# Patient Record
Sex: Female | Born: 1950 | Race: White | Hispanic: No | Marital: Married | Smoking: Never smoker
Health system: Southern US, Community
[De-identification: ages and names within clinical notes are randomized; demographics above are authoritative.]

## PROBLEM LIST (undated history)

## (undated) ENCOUNTER — Emergency Department (HOSPITAL_BASED_OUTPATIENT_CLINIC_OR_DEPARTMENT_OTHER): Admission: EM | Payer: Medicare Other

## (undated) DIAGNOSIS — M79604 Pain in right leg: Secondary | ICD-10-CM

## (undated) DIAGNOSIS — F329 Major depressive disorder, single episode, unspecified: Secondary | ICD-10-CM

## (undated) DIAGNOSIS — R079 Chest pain, unspecified: Secondary | ICD-10-CM

## (undated) DIAGNOSIS — Z8489 Family history of other specified conditions: Secondary | ICD-10-CM

## (undated) DIAGNOSIS — M069 Rheumatoid arthritis, unspecified: Secondary | ICD-10-CM

## (undated) DIAGNOSIS — F32A Depression, unspecified: Secondary | ICD-10-CM

## (undated) DIAGNOSIS — S42209A Unspecified fracture of upper end of unspecified humerus, initial encounter for closed fracture: Secondary | ICD-10-CM

## (undated) DIAGNOSIS — Z973 Presence of spectacles and contact lenses: Secondary | ICD-10-CM

## (undated) DIAGNOSIS — N390 Urinary tract infection, site not specified: Secondary | ICD-10-CM

## (undated) DIAGNOSIS — M8580 Other specified disorders of bone density and structure, unspecified site: Secondary | ICD-10-CM

## (undated) HISTORY — DX: Other specified disorders of bone density and structure, unspecified site: M85.80

## (undated) HISTORY — DX: Rheumatoid arthritis, unspecified: M06.9

## (undated) HISTORY — DX: Pain in right leg: M79.604

## (undated) HISTORY — PX: FRACTURE SURGERY: SHX138

## (undated) HISTORY — PX: ABDOMINAL HYSTERECTOMY: SHX81

## (undated) HISTORY — PX: COLONOSCOPY: SHX174

## (undated) HISTORY — PX: PARTIAL HYSTERECTOMY: SHX80

## (undated) HISTORY — PX: TONSILLECTOMY: SUR1361

## (undated) HISTORY — DX: Urinary tract infection, site not specified: N39.0

## (undated) HISTORY — DX: Chest pain, unspecified: R07.9

## (undated) HISTORY — PX: WISDOM TOOTH EXTRACTION: SHX21

## (undated) HISTORY — PX: KNEE ARTHROSCOPY: SHX127

---

## 1998-11-25 ENCOUNTER — Other Ambulatory Visit: Admission: RE | Admit: 1998-11-25 | Discharge: 1998-11-25 | Payer: Self-pay | Admitting: *Deleted

## 2000-02-10 ENCOUNTER — Other Ambulatory Visit: Admission: RE | Admit: 2000-02-10 | Discharge: 2000-02-10 | Payer: Self-pay | Admitting: *Deleted

## 2001-05-03 ENCOUNTER — Other Ambulatory Visit: Admission: RE | Admit: 2001-05-03 | Discharge: 2001-05-03 | Payer: Self-pay | Admitting: *Deleted

## 2003-01-21 ENCOUNTER — Other Ambulatory Visit: Admission: RE | Admit: 2003-01-21 | Discharge: 2003-01-21 | Payer: Self-pay | Admitting: Obstetrics and Gynecology

## 2007-05-25 ENCOUNTER — Encounter: Admission: RE | Admit: 2007-05-25 | Discharge: 2007-05-25 | Payer: Self-pay | Admitting: Family Medicine

## 2010-09-03 ENCOUNTER — Encounter: Admission: RE | Admit: 2010-09-03 | Discharge: 2010-09-03 | Payer: Self-pay | Admitting: Rheumatology

## 2010-12-05 ENCOUNTER — Encounter: Payer: Self-pay | Admitting: Family Medicine

## 2016-04-19 ENCOUNTER — Other Ambulatory Visit: Payer: Self-pay

## 2016-04-19 ENCOUNTER — Other Ambulatory Visit: Payer: Self-pay | Admitting: Family Medicine

## 2016-04-19 DIAGNOSIS — Z1231 Encounter for screening mammogram for malignant neoplasm of breast: Secondary | ICD-10-CM

## 2016-04-20 ENCOUNTER — Ambulatory Visit
Admission: RE | Admit: 2016-04-20 | Discharge: 2016-04-20 | Disposition: A | Discharge status: Home / Self Care | Payer: 59 | Source: Ambulatory Visit | Attending: Family Medicine | Admitting: Family Medicine

## 2016-04-20 DIAGNOSIS — Z1231 Encounter for screening mammogram for malignant neoplasm of breast: Secondary | ICD-10-CM

## 2016-07-27 DIAGNOSIS — M8580 Other specified disorders of bone density and structure, unspecified site: Secondary | ICD-10-CM | POA: Insufficient documentation

## 2016-07-27 DIAGNOSIS — G4700 Insomnia, unspecified: Secondary | ICD-10-CM | POA: Insufficient documentation

## 2016-07-27 DIAGNOSIS — M25569 Pain in unspecified knee: Secondary | ICD-10-CM | POA: Insufficient documentation

## 2016-07-27 DIAGNOSIS — G4762 Sleep related leg cramps: Secondary | ICD-10-CM | POA: Insufficient documentation

## 2016-07-27 DIAGNOSIS — M069 Rheumatoid arthritis, unspecified: Secondary | ICD-10-CM | POA: Insufficient documentation

## 2016-11-01 ENCOUNTER — Other Ambulatory Visit: Payer: Self-pay | Admitting: Family Medicine

## 2016-11-01 ENCOUNTER — Ambulatory Visit (HOSPITAL_COMMUNITY)
Admission: RE | Admit: 2016-11-01 | Discharge: 2016-11-01 | Disposition: A | Discharge status: Home / Self Care | Payer: 59 | Source: Ambulatory Visit | Attending: Cardiology | Admitting: Cardiology

## 2016-11-01 ENCOUNTER — Other Ambulatory Visit (HOSPITAL_COMMUNITY): Payer: Self-pay | Admitting: Family Medicine

## 2016-11-01 DIAGNOSIS — R52 Pain, unspecified: Secondary | ICD-10-CM

## 2016-11-01 DIAGNOSIS — M79604 Pain in right leg: Secondary | ICD-10-CM | POA: Insufficient documentation

## 2016-11-01 NOTE — Progress Notes (Signed)
VASCULAR LAB PRELIMINARY  PRELIMINARY  PRELIMINARY  PRELIMINARY  Right lower extremity venous duplex completed.    Preliminary report:  Right:  No evidence of DVT, superficial thrombosis, or Baker's cyst.  Rashida Ladouceur, RVS 11/01/2016, 10:12 AM

## 2016-11-08 ENCOUNTER — Telehealth: Payer: Self-pay | Admitting: Cardiology

## 2016-11-08 NOTE — Telephone Encounter (Signed)
Received records from [facility] for appointment on 11/21/16 wiith Dr Swaziland.  Records given to [facility] (medical records) for Dr Elvis Coil schedule on 11/21/16. lp

## 2016-11-21 ENCOUNTER — Ambulatory Visit: Payer: Self-pay | Admitting: Cardiology

## 2016-12-11 NOTE — Progress Notes (Signed)
Cardiology Office Note    Date:  12/12/2016   ID:  [name], DOB 1951/04/20, MRN [id number]  PCP:  Lilia Argue  Cardiologist:  Peter Swaziland, MD    History of Present Illness:  Sonya Fowler is a 66 y.o. female seen for evaluation of chest pain. She has a history of RA, mild hypercholesterolemia, and strong family history of CAD. She reports that around Thanksgiving she had some chest pain underneath her left breast. This was at rest- typically lying down. No radiation. No dyspnea, palpitations, N/V, or diaphoresis. It lasted several days then resolved. She reports she had a nuclear stress test about 20 years ago. Otherwise no prior cardiac work up. No history of DM, HTN, or tobacco use.    Past Medical History:  Diagnosis Date  . Chest pain   . Leg pain, right   . Osteopenia   . Rheumatoid arthritis (HCC)   . UTI (urinary tract infection)     Past Surgical History:  Procedure Laterality Date  . KNEE ARTHROSCOPY Right   . PARTIAL HYSTERECTOMY    . WISDOM TOOTH EXTRACTION      Current Medications: Outpatient Medications Prior to Visit  Medication Sig Dispense Refill  . cetirizine (ZYRTEC) 10 MG tablet Take 10 mg by mouth daily.    . DOCOSAHEXAENOIC ACID PO Take 1 g by mouth daily.    Marland Kitchen estrogens, conjugated, (PREMARIN) 0.3 MG tablet Take 1 tablet by mouth daily.    Marland Kitchen etanercept (ENBREL) 50 MG/ML injection Inject 50 mg into the skin once a week.    . folic acid (FOLVITE) 1 MG tablet Take 1 mg by mouth daily.    . Magnesium Gluconate 27.5 MG TABS Take 500 mg by mouth 2 (two) times daily.    . meloxicam (MOBIC) 15 MG tablet Take 15 mg by mouth daily.    . methotrexate (RHEUMATREX) 2.5 MG tablet Take 7 tablets by mouth once a week.      No facility-administered medications prior to visit.      Allergies:   Morphine; Penicillin g; and Sulfamethoxazole   Social History   Social History  . Marital status: Married    Spouse name: N/A  . Number of  children: 2  . Years of education: N/A   Social History Main Topics  . Smoking status: Never Smoker  . Smokeless tobacco: Never Used  . Alcohol use None  . Drug use: Unknown  . Sexual activity: Not Asked   Other Topics Concern  . None   Social History Narrative  . None     Family History:  The patient's family history includes Atrial fibrillation in her mother; Heart disease in her brother, father, and sister; Hypertension in her brother, father, and sister; Valvular heart disease in her mother.   ROS:   Please see the history of present illness. She does have some right knee pain from an old injury.    ROS All other systems reviewed and are negative.   PHYSICAL EXAM:   VS:  BP 128/82   Pulse 79   Ht 5\' 4"  (1.626 m)   Wt 132 lb 12.8 oz (60.2 kg)   BMI 22.80 kg/m    GEN: Well nourished, well developed, in no acute distress  HEENT: normal  Neck: no JVD, carotid bruits, or masses Cardiac: RRR; no murmurs, rubs, or gallops,no edema  Respiratory:  clear to auscultation bilaterally, normal work of breathing GI: soft, nontender, nondistended, + BS MS: no deformity or atrophy  Skin: warm and dry, no rash Neuro:  Alert and Oriented x 3, Strength and sensation are intact Psych: euthymic mood, full affect  Wt Readings from Last 3 Encounters:  12/12/16 132 lb 12.8 oz (60.2 kg)      Studies/Labs Reviewed:   EKG:  EKG is ordered today.  The ekg ordered today demonstrates NSR with septal Q waves in V1-2. Otherwise normal. I have personally reviewed and interpreted this study.   Recent Labs: No results found for requested labs within last 8760 hours.   Lipid Panel No results found for: CHOL, TRIG, HDL, CHOLHDL, VLDL, LDLCALC, LDLDIRECT  Additional studies/ records that were reviewed today include:  Labs from 10/26/16: cholesterol 191, triglycerides 104, HDL 72, LDL 107. CMET and CBC normal.  ASSESSMENT:    1. Rheumatoid arthritis, involving unspecified site, unspecified  rheumatoid factor presence (HCC)   2. Chest pain at rest   3. Family history of early CAD      PLAN:  In order of problems listed above:  1. She had atypical chest pain in November. She has risk factors for CAD including RA, mild hypercholesterolemia, and very strong family history of CAD. This places her at moderate pretest risk for CAD. I have recommended evaluation with stress testing. We discussed options including nuclear stress testing, Echo, or plain old treadmill testing. Given abnormal Ecg I would recommend a stress Echo. Will arrange at her convenience.     Medication Adjustments/Labs and Tests Ordered: Current medicines are reviewed at length with the patient today.  Concerns regarding medicines are outlined above.  Medication changes, Labs and Tests ordered today are listed in the Patient Instructions below. Patient Instructions  We will schedule you for a stress Echo      Signed, Peter Swaziland, MD  12/12/2016 3:48 PM    [facility] [address], [place], [place], [address] [phone]

## 2016-12-12 ENCOUNTER — Ambulatory Visit (INDEPENDENT_AMBULATORY_CARE_PROVIDER_SITE_OTHER): Payer: BLUE CROSS/BLUE SHIELD | Admitting: Cardiology

## 2016-12-12 ENCOUNTER — Encounter: Payer: Self-pay | Admitting: Cardiology

## 2016-12-12 VITALS — BP 128/82 | HR 79 | Ht 64.0 in | Wt 132.8 lb

## 2016-12-12 DIAGNOSIS — Z8249 Family history of ischemic heart disease and other diseases of the circulatory system: Secondary | ICD-10-CM | POA: Diagnosis not present

## 2016-12-12 DIAGNOSIS — R079 Chest pain, unspecified: Secondary | ICD-10-CM

## 2016-12-12 DIAGNOSIS — M069 Rheumatoid arthritis, unspecified: Secondary | ICD-10-CM | POA: Diagnosis not present

## 2016-12-12 NOTE — Patient Instructions (Signed)
We will schedule you for a stress Echo.   

## 2016-12-29 ENCOUNTER — Telehealth (HOSPITAL_COMMUNITY): Payer: Self-pay | Admitting: *Deleted

## 2016-12-29 NOTE — Telephone Encounter (Signed)
Patient given detailed instructions per Stress Test Requisition Sheet for test on 01/03/17 at 2:30.Patient Notified to arrive 30 minutes early, and that it is imperative to arrive on time for appointment to keep from having the test rescheduled.  Patient verbalized understanding. Sonya Fowler

## 2017-01-03 ENCOUNTER — Ambulatory Visit (HOSPITAL_COMMUNITY): Payer: BLUE CROSS/BLUE SHIELD

## 2017-01-03 ENCOUNTER — Ambulatory Visit (HOSPITAL_COMMUNITY): Payer: BLUE CROSS/BLUE SHIELD | Attending: Cardiovascular Disease

## 2017-01-03 DIAGNOSIS — Z8249 Family history of ischemic heart disease and other diseases of the circulatory system: Secondary | ICD-10-CM | POA: Insufficient documentation

## 2017-01-03 DIAGNOSIS — R079 Chest pain, unspecified: Secondary | ICD-10-CM | POA: Diagnosis not present

## 2017-01-03 DIAGNOSIS — M069 Rheumatoid arthritis, unspecified: Secondary | ICD-10-CM | POA: Diagnosis present

## 2017-06-27 ENCOUNTER — Other Ambulatory Visit: Payer: Self-pay | Admitting: Family Medicine

## 2017-06-27 DIAGNOSIS — Z1231 Encounter for screening mammogram for malignant neoplasm of breast: Secondary | ICD-10-CM

## 2017-07-12 ENCOUNTER — Ambulatory Visit
Admission: RE | Admit: 2017-07-12 | Discharge: 2017-07-12 | Disposition: A | Discharge status: Home / Self Care | Payer: BLUE CROSS/BLUE SHIELD | Source: Ambulatory Visit | Attending: Family Medicine | Admitting: Family Medicine

## 2017-07-12 DIAGNOSIS — Z1231 Encounter for screening mammogram for malignant neoplasm of breast: Secondary | ICD-10-CM

## 2017-09-08 ENCOUNTER — Encounter: Payer: Self-pay | Admitting: Physician Assistant

## 2017-09-27 ENCOUNTER — Ambulatory Visit: Payer: BLUE CROSS/BLUE SHIELD | Admitting: Physician Assistant

## 2017-10-10 ENCOUNTER — Encounter: Payer: Self-pay | Admitting: Internal Medicine

## 2017-12-05 ENCOUNTER — Ambulatory Visit: Payer: BLUE CROSS/BLUE SHIELD | Admitting: Internal Medicine

## 2018-06-18 ENCOUNTER — Other Ambulatory Visit: Payer: Self-pay | Admitting: Family Medicine

## 2018-06-18 DIAGNOSIS — Z1231 Encounter for screening mammogram for malignant neoplasm of breast: Secondary | ICD-10-CM

## 2018-09-18 ENCOUNTER — Ambulatory Visit: Payer: BLUE CROSS/BLUE SHIELD

## 2018-10-04 ENCOUNTER — Ambulatory Visit
Admission: RE | Admit: 2018-10-04 | Discharge: 2018-10-04 | Disposition: A | Discharge status: Home / Self Care | Payer: BLUE CROSS/BLUE SHIELD | Source: Ambulatory Visit | Attending: Family Medicine | Admitting: Family Medicine

## 2018-10-04 DIAGNOSIS — Z1231 Encounter for screening mammogram for malignant neoplasm of breast: Secondary | ICD-10-CM

## 2019-02-26 ENCOUNTER — Other Ambulatory Visit: Payer: Self-pay

## 2019-02-26 ENCOUNTER — Emergency Department (HOSPITAL_COMMUNITY)
Admission: EM | Admit: 2019-02-26 | Discharge: 2019-02-26 | Disposition: A | Discharge status: Home / Self Care | Payer: Medicare Other | Attending: Emergency Medicine | Admitting: Emergency Medicine

## 2019-02-26 ENCOUNTER — Emergency Department (HOSPITAL_COMMUNITY): Payer: Medicare Other

## 2019-02-26 ENCOUNTER — Encounter (HOSPITAL_COMMUNITY): Payer: Self-pay | Admitting: *Deleted

## 2019-02-26 DIAGNOSIS — S4992XA Unspecified injury of left shoulder and upper arm, initial encounter: Secondary | ICD-10-CM | POA: Diagnosis present

## 2019-02-26 DIAGNOSIS — Z79899 Other long term (current) drug therapy: Secondary | ICD-10-CM | POA: Insufficient documentation

## 2019-02-26 DIAGNOSIS — Z880 Allergy status to penicillin: Secondary | ICD-10-CM | POA: Insufficient documentation

## 2019-02-26 DIAGNOSIS — Y999 Unspecified external cause status: Secondary | ICD-10-CM | POA: Insufficient documentation

## 2019-02-26 DIAGNOSIS — Y9389 Activity, other specified: Secondary | ICD-10-CM | POA: Insufficient documentation

## 2019-02-26 DIAGNOSIS — S42202A Unspecified fracture of upper end of left humerus, initial encounter for closed fracture: Secondary | ICD-10-CM

## 2019-02-26 DIAGNOSIS — Y92003 Bedroom of unspecified non-institutional (private) residence as the place of occurrence of the external cause: Secondary | ICD-10-CM | POA: Insufficient documentation

## 2019-02-26 DIAGNOSIS — W010XXA Fall on same level from slipping, tripping and stumbling without subsequent striking against object, initial encounter: Secondary | ICD-10-CM | POA: Insufficient documentation

## 2019-02-26 MED ORDER — OXYCODONE HCL 5 MG PO TABS
5.0000 mg | ORAL_TABLET | Freq: Once | ORAL | Status: AC
  Administered 2019-02-26: 02:00:00 5 mg via ORAL
  Filled 2019-02-26: qty 1

## 2019-02-26 MED ORDER — OXYCODONE HCL 5 MG PO TABS: 5.0000 mg | ORAL_TABLET | ORAL | 0 refills | Status: DC | PRN

## 2019-02-26 MED ORDER — OXYCODONE HCL 5 MG PO TABS
5.0000 mg | ORAL_TABLET | Freq: Once | ORAL | Status: AC
  Administered 2019-02-26: 5 mg via ORAL
  Filled 2019-02-26: qty 1

## 2019-02-26 NOTE — ED Triage Notes (Signed)
Pt stated "I got up to go to the BR and fell."  Pt c/o left shoulder injury.  Pt stated "I took 1 of my husband's pain pills and it didn't help it but I had also taken 2 tylenol before."

## 2019-02-26 NOTE — ED Provider Notes (Signed)
[facility]-EMERGENCY DEPT Provider Note   CSN: [id number] Arrival date & time: 02/26/19  0013    History   Chief Complaint Chief Complaint  Patient presents with  . Shoulder Injury    left    HPI Sonya Fowler is a 68 y.o. female  The history is provided by the patient.  She has history of rheumatoid arthritis with osteopenia and comes in having fallen at home and injured her left upper arm.  She was getting out of bed to go to the bathroom and fell.  She is not sure how she fell.  At home, she took acetaminophen and her husband gave her a dose of oxycodone, but she has not gotten any significant relief with that.  She rates pain a 10/10.  She denies other injury.  Past Medical History:  Diagnosis Date  . Chest pain   . Leg pain, right   . Osteopenia   . Rheumatoid arthritis (HCC)   . UTI (urinary tract infection)     Patient Active Problem List   Diagnosis Date Noted  . Chest pain at rest 12/12/2016  . Family history of early CAD 12/12/2016  . Insomnia 07/27/2016  . Knee pain 07/27/2016  . Osteopenia 07/27/2016  . Rheumatoid arthritis (HCC) 07/27/2016  . Sleep related leg cramps 07/27/2016    Past Surgical History:  Procedure Laterality Date  . KNEE ARTHROSCOPY Right   . PARTIAL HYSTERECTOMY    . WISDOM TOOTH EXTRACTION       OB History   No obstetric history on file.      Home Medications    Prior to Admission medications   Medication Sig Start Date End Date Taking? Authorizing Provider  cetirizine (ZYRTEC) 10 MG tablet Take 10 mg by mouth daily.    [provider]  DOCOSAHEXAENOIC ACID PO Take 1 g by mouth daily.    [provider]  estrogens, conjugated, (PREMARIN) 0.3 MG tablet Take 1 tablet by mouth daily. 10/31/16   [provider]  etanercept (ENBREL) 50 MG/ML injection Inject 50 mg into the skin once a week.    [provider]  folic acid (FOLVITE) 1 MG tablet Take 1 mg by mouth  daily. 10/18/16   [provider]  Magnesium Gluconate 27.5 MG TABS Take 500 mg by mouth 2 (two) times daily.    [provider]  meloxicam (MOBIC) 15 MG tablet Take 15 mg by mouth daily.    [provider]  methotrexate (RHEUMATREX) 2.5 MG tablet Take 7 tablets by mouth once a week.     [provider]    Family History Family History  Problem Relation Age of Onset  . Valvular heart disease Mother   . Atrial fibrillation Mother   . Hypertension Father   . Heart disease Father   . Heart disease Sister   . Hypertension Sister   . Heart disease Brother   . Hypertension Brother     Social History Social History   Tobacco Use  . Smoking status: Never Smoker  . Smokeless tobacco: Never Used  Substance Use Topics  . Alcohol use: Yes    Comment: socially  . Drug use: Never     Allergies   Morphine; Penicillin g; and Sulfamethoxazole   Review of Systems Review of Systems  All other systems reviewed and are negative.    Physical Exam Updated Vital Signs BP 127/74 (BP Location: Right Arm)   Pulse 84   Temp 98.3  F (36.8 C) (Oral)   Resp 17   Ht 5\' 4"  (1.626 m)   Wt 56.2 kg   SpO2 99%   BMI 21.28 kg/m   Physical Exam Vitals signs and nursing note reviewed.    68 year old female, resting comfortably and in no acute distress. Vital signs are normal. Oxygen saturation is 99%, which is normal. Head is normocephalic and atraumatic. PERRLA, EOMI. Oropharynx is clear. Neck is nontender and supple without adenopathy or JVD. Back is nontender and there is no CVA tenderness. Lungs are clear without rales, wheezes, or rhonchi. Chest is nontender. Heart has regular rate and rhythm without murmur. Abdomen is soft, flat, nontender without masses or hepatosplenomegaly and peristalsis is normoactive. Extremities: Swelling and deformity of left upper arm.  Arm is grossly unstable.  Distal neurovascular exam is intact with strong pulses, prompt  capillary refill, normal sensation, normal strength of distal musculature.  Remainder of extremity exam is unremarkable. Skin is warm and dry without rash. Neurologic: Mental status is normal, cranial nerves are intact, there are no motor or sensory deficits.  ED Treatments / Results   Radiology Dg Shoulder Left  Result Date: 02/26/2019 CLINICAL DATA:  Recent fall with arm pain, initial encounter EXAM: LEFT SHOULDER - 2+ VIEW COMPARISON:  None. FINDINGS: Comminuted fracture of the proximal to mid left humerus is noted extending into the region of the surgical neck. The humeral head is rotated but does not appear dislocated. No other bony abnormality is seen. IMPRESSION: Comminuted proximal to mid left humeral fracture. Electronically Signed   By: Alcide Clever M.D.   On: 02/26/2019 01:30   Dg Humerus Left  Result Date: 02/26/2019 CLINICAL DATA:  Recent fall with left arm pain, initial encounter EXAM: LEFT HUMERUS - 2+ VIEW COMPARISON:  None. FINDINGS: Comminuted proximal to mid left humeral fracture with extension into the surgical neck. No other fracture is seen. IMPRESSION: Comminuted left humeral fracture. Electronically Signed   By: Alcide Clever M.D.   On: 02/26/2019 01:31    Procedures .Splint Application Date/Time: 02/26/2019 2:30 AM Performed by: Dione Booze, MD Authorized by: Dione Booze, MD   Consent:    Consent obtained:  Verbal   Consent given by:  Patient   Risks discussed:  Numbness, pain and swelling   Alternatives discussed:  Alternative treatment Pre-procedure details:    Sensation:  Normal   Skin color:  Normal Procedure details:    Laterality:  Left   Location:  Arm   Arm:  L upper arm   Strapping: no     Splint type: Coaptation splint.   Supplies:  Cotton padding, Ortho-Glass and sling Post-procedure details:    Pain:  Improved   Sensation:  Normal   Skin color:  Normal   Patient tolerance of procedure:  Tolerated well, no immediate complications      Medications Ordered in ED Medications  oxyCODONE (Oxy IR/ROXICODONE) immediate release tablet 5 mg (has no administration in time range)     Initial Impression / Assessment and Plan / ED Course  I have reviewed the triage vital signs and the nursing notes.  Pertinent imaging results that were available during my care of the patient were reviewed by me and considered in my medical decision making (see chart for details).  Fall with injury to left upper arm.  X-rays show comminuted fracture of the proximal humerus extending to the surgical neck.  Case was discussed with Dr. Lequita Halt, on-call for orthopedics.  He requests she be  placed in a coaptation splint and referred to his office for follow-up.  Old records are reviewed, and she has no relevant past visits.  Following placement in splint, pain was somewhat improved.  She is instructed to call the orthopedic surgeon for follow-up visit at which time decision will be made about operative versus nonoperative management.  She is discharged with prescription for oxycodone.  Final Clinical Impressions(s) / ED Diagnoses   Final diagnoses:  Closed fracture of proximal end of left humerus, initial encounter    ED Discharge Orders         Ordered    oxyCODONE (OXY IR/ROXICODONE) 5 MG immediate release tablet  Every 4 hours PRN     02/26/19 0337           Dione BoozeGlick, Layia Walla, MD 02/26/19 [phone]

## 2019-02-26 NOTE — ED Notes (Signed)
Ortho at bedside.

## 2019-02-26 NOTE — Progress Notes (Signed)
Orthopedic Tech Progress Note Patient Details:  Sonya Fowler 01/19/1951 [id number]  Ortho Devices Type of Ortho Device: Arm sling, Coapt Ortho Device/Splint Location: lue Ortho Device/Splint Interventions: Ordered, Application, Adjustment   Post Interventions Patient Tolerated: Fair Instructions Provided: Care of device, Adjustment of device   Trinna Post 02/26/2019, 3:13 AM

## 2019-02-26 NOTE — Discharge Instructions (Addendum)
Apply ice for thirty minutes at a time as needed.  Call the orthopedic surgeon and ask for an appointment as soon as possible.

## 2019-02-26 NOTE — ED Notes (Signed)
Pt verbalized discharge instructions and follow up care. Alert and ambulatory. Husband picking pt up

## 2019-02-27 ENCOUNTER — Other Ambulatory Visit: Payer: Self-pay

## 2019-02-27 ENCOUNTER — Encounter (HOSPITAL_COMMUNITY): Payer: Self-pay | Admitting: *Deleted

## 2019-02-27 MED ORDER — TRANEXAMIC ACID-NACL 1000-0.7 MG/100ML-% IV SOLN
1000.0000 mg | INTRAVENOUS | Status: AC
  Administered 2019-02-28: 1000 mg via INTRAVENOUS
  Filled 2019-02-27: qty 100

## 2019-02-27 NOTE — Progress Notes (Signed)
Pt denies SOB, chest pain, and being under the care of a cardiologist. Pt denies having a cardiac cath. Pt denies having a chest x ray within the last year. Pt denies recent labs. Pt made aware to stop taking Aspirin (unless otherwise advised by surgeon), vitamins, fish and Black Cumin oil, Tumeric and herbal medications. Do not take any NSAIDs ie: Ibuprofen, Advil, Naproxen (Aleve), Motrin, Mobic, BC and Goody Powder. Coronavirus Screening Pt denies that she and her spouse tested positive for COVID-19 Pt denies that she and her spouse experienced the following symptoms:  Cough yes/no: No Fever (>100.15F)  yes/no: No Runny nose yes/no: No Sore throat yes/no: No Difficulty breathing/shortness of breath  yes/no: No  Have you or a family member traveled in the last 14 days and where? yes/no: No  Pt reminded  that hospital visitation restrictions are in effect and the importance of the restrictions.  Pt verbalized understanding of all pre-op instructions.

## 2019-02-28 ENCOUNTER — Encounter (HOSPITAL_COMMUNITY): Payer: Self-pay | Admitting: *Deleted

## 2019-02-28 ENCOUNTER — Ambulatory Visit (HOSPITAL_COMMUNITY): Payer: Medicare Other

## 2019-02-28 ENCOUNTER — Observation Stay (HOSPITAL_COMMUNITY)
Admission: RE | Admit: 2019-02-28 | Discharge: 2019-03-01 | Disposition: A | Discharge status: Home / Self Care | Payer: Medicare Other | Attending: Orthopedic Surgery | Admitting: Orthopedic Surgery

## 2019-02-28 ENCOUNTER — Encounter (HOSPITAL_COMMUNITY)
Admission: RE | Disposition: A | Discharge status: Home / Self Care | Payer: Self-pay | Source: Home / Self Care | Attending: Orthopedic Surgery

## 2019-02-28 ENCOUNTER — Other Ambulatory Visit: Payer: Self-pay

## 2019-02-28 ENCOUNTER — Ambulatory Visit (HOSPITAL_COMMUNITY): Payer: Medicare Other | Admitting: Certified Registered"

## 2019-02-28 DIAGNOSIS — F329 Major depressive disorder, single episode, unspecified: Secondary | ICD-10-CM | POA: Diagnosis not present

## 2019-02-28 DIAGNOSIS — Z8249 Family history of ischemic heart disease and other diseases of the circulatory system: Secondary | ICD-10-CM | POA: Insufficient documentation

## 2019-02-28 DIAGNOSIS — Z791 Long term (current) use of non-steroidal anti-inflammatories (NSAID): Secondary | ICD-10-CM | POA: Diagnosis not present

## 2019-02-28 DIAGNOSIS — M8580 Other specified disorders of bone density and structure, unspecified site: Secondary | ICD-10-CM | POA: Insufficient documentation

## 2019-02-28 DIAGNOSIS — S42309A Unspecified fracture of shaft of humerus, unspecified arm, initial encounter for closed fracture: Secondary | ICD-10-CM | POA: Diagnosis present

## 2019-02-28 DIAGNOSIS — S42202A Unspecified fracture of upper end of left humerus, initial encounter for closed fracture: Secondary | ICD-10-CM | POA: Diagnosis present

## 2019-02-28 DIAGNOSIS — M069 Rheumatoid arthritis, unspecified: Secondary | ICD-10-CM | POA: Diagnosis not present

## 2019-02-28 DIAGNOSIS — Z79899 Other long term (current) drug therapy: Secondary | ICD-10-CM | POA: Diagnosis not present

## 2019-02-28 DIAGNOSIS — Z880 Allergy status to penicillin: Secondary | ICD-10-CM | POA: Diagnosis not present

## 2019-02-28 DIAGNOSIS — W1830XA Fall on same level, unspecified, initial encounter: Secondary | ICD-10-CM | POA: Diagnosis not present

## 2019-02-28 DIAGNOSIS — Z881 Allergy status to other antibiotic agents status: Secondary | ICD-10-CM | POA: Diagnosis not present

## 2019-02-28 DIAGNOSIS — Z885 Allergy status to narcotic agent status: Secondary | ICD-10-CM | POA: Insufficient documentation

## 2019-02-28 DIAGNOSIS — Z882 Allergy status to sulfonamides status: Secondary | ICD-10-CM | POA: Diagnosis not present

## 2019-02-28 DIAGNOSIS — Z419 Encounter for procedure for purposes other than remedying health state, unspecified: Secondary | ICD-10-CM

## 2019-02-28 DIAGNOSIS — Z90710 Acquired absence of both cervix and uterus: Secondary | ICD-10-CM | POA: Diagnosis not present

## 2019-02-28 DIAGNOSIS — S42292A Other displaced fracture of upper end of left humerus, initial encounter for closed fracture: Secondary | ICD-10-CM | POA: Diagnosis not present

## 2019-02-28 HISTORY — DX: Presence of spectacles and contact lenses: Z97.3

## 2019-02-28 HISTORY — DX: Depression, unspecified: F32.A

## 2019-02-28 HISTORY — DX: Family history of other specified conditions: Z84.89

## 2019-02-28 HISTORY — DX: Unspecified fracture of upper end of unspecified humerus, initial encounter for closed fracture: S42.209A

## 2019-02-28 HISTORY — DX: Major depressive disorder, single episode, unspecified: F32.9

## 2019-02-28 HISTORY — PX: ORIF HUMERUS FRACTURE: SHX2126

## 2019-02-28 LAB — CBC
HCT: 37.8 % (ref 36.0–46.0)
Hemoglobin: 12 g/dL (ref 12.0–15.0)
MCH: 32.4 pg (ref 26.0–34.0)
MCHC: 31.7 g/dL (ref 30.0–36.0)
MCV: 102.2 fL — ABNORMAL HIGH (ref 80.0–100.0)
Platelets: 156 10*3/uL (ref 150–400)
RBC: 3.7 MIL/uL — ABNORMAL LOW (ref 3.87–5.11)
RDW: 12.8 % (ref 11.5–15.5)
WBC: 5.5 10*3/uL (ref 4.0–10.5)
nRBC: 0 % (ref 0.0–0.2)

## 2019-02-28 LAB — BASIC METABOLIC PANEL
Anion gap: 11 (ref 5–15)
BUN: 11 mg/dL (ref 8–23)
CO2: 23 mmol/L (ref 22–32)
Calcium: 8.4 mg/dL — ABNORMAL LOW (ref 8.9–10.3)
Chloride: 103 mmol/L (ref 98–111)
Creatinine, Ser: 0.8 mg/dL (ref 0.44–1.00)
GFR calc Af Amer: >60 mL/min (ref >60)
GFR calc non Af Amer: >60 mL/min (ref >60)
Glucose, Bld: 106 mg/dL — ABNORMAL HIGH (ref 70–99)
Potassium: 3.5 mmol/L (ref 3.5–5.1)
Sodium: 137 mmol/L (ref 135–145)

## 2019-02-28 SURGERY — OPEN REDUCTION INTERNAL FIXATION (ORIF) PROXIMAL HUMERUS FRACTURE
Anesthesia: Regional | Site: Arm Upper | Laterality: Left

## 2019-02-28 MED ORDER — CHLORHEXIDINE GLUCONATE 4 % EX LIQD: 60.0000 mL | Freq: Once | CUTANEOUS | Status: DC

## 2019-02-28 MED ORDER — DEXAMETHASONE SODIUM PHOSPHATE 10 MG/ML IJ SOLN
INTRAMUSCULAR | Status: AC
  Filled 2019-02-28: qty 1

## 2019-02-28 MED ORDER — METHOCARBAMOL 1000 MG/10ML IJ SOLN
500.0000 mg | Freq: Four times a day (QID) | INTRAVENOUS | Status: DC | PRN
  Filled 2019-02-28: qty 5

## 2019-02-28 MED ORDER — MIDAZOLAM HCL 2 MG/2ML IJ SOLN
INTRAMUSCULAR | Status: AC
  Administered 2019-02-28: 2 mg via INTRAVENOUS
  Filled 2019-02-28: qty 2

## 2019-02-28 MED ORDER — SODIUM CHLORIDE 0.9 % IV SOLN
INTRAVENOUS | Status: DC | PRN
  Administered 2019-02-28: 50 ug/min via INTRAVENOUS

## 2019-02-28 MED ORDER — LIDOCAINE 2% (20 MG/ML) 5 ML SYRINGE
INTRAMUSCULAR | Status: AC
  Filled 2019-02-28: qty 5

## 2019-02-28 MED ORDER — MIDAZOLAM HCL 2 MG/2ML IJ SOLN
2.0000 mg | Freq: Once | INTRAMUSCULAR | Status: AC
  Administered 2019-02-28: 09:00:00 2 mg via INTRAVENOUS

## 2019-02-28 MED ORDER — PROPOFOL 10 MG/ML IV BOLUS
INTRAVENOUS | Status: AC
  Filled 2019-02-28: qty 20

## 2019-02-28 MED ORDER — DIPHENHYDRAMINE HCL 50 MG/ML IJ SOLN
INTRAMUSCULAR | Status: AC
  Filled 2019-02-28: qty 1

## 2019-02-28 MED ORDER — 0.9 % SODIUM CHLORIDE (POUR BTL) OPTIME
TOPICAL | Status: DC | PRN
  Administered 2019-02-28: 1000 mL

## 2019-02-28 MED ORDER — BISACODYL 5 MG PO TBEC: 5.0000 mg | DELAYED_RELEASE_TABLET | Freq: Every day | ORAL | Status: DC | PRN

## 2019-02-28 MED ORDER — FENTANYL CITRATE (PF) 250 MCG/5ML IJ SOLN
INTRAMUSCULAR | Status: AC
  Filled 2019-02-28: qty 5

## 2019-02-28 MED ORDER — PHENOL 1.4 % MT LIQD: 1.0000 | OROMUCOSAL | Status: DC | PRN

## 2019-02-28 MED ORDER — PROPOFOL 10 MG/ML IV BOLUS
INTRAVENOUS | Status: DC | PRN
  Administered 2019-02-28: 120 mg via INTRAVENOUS

## 2019-02-28 MED ORDER — ACETAMINOPHEN 500 MG PO TABS
1000.0000 mg | ORAL_TABLET | Freq: Once | ORAL | Status: AC
  Administered 2019-02-28: 1000 mg via ORAL

## 2019-02-28 MED ORDER — PHENYLEPHRINE 40 MCG/ML (10ML) SYRINGE FOR IV PUSH (FOR BLOOD PRESSURE SUPPORT)
PREFILLED_SYRINGE | INTRAVENOUS | Status: DC | PRN
  Administered 2019-02-28 (×3): 120 ug via INTRAVENOUS

## 2019-02-28 MED ORDER — BUPIVACAINE LIPOSOME 1.3 % IJ SUSP
INTRAMUSCULAR | Status: DC | PRN
  Administered 2019-02-28: 10 mL via PERINEURAL

## 2019-02-28 MED ORDER — SUCCINYLCHOLINE CHLORIDE 200 MG/10ML IV SOSY
PREFILLED_SYRINGE | INTRAVENOUS | Status: AC
  Filled 2019-02-28: qty 30

## 2019-02-28 MED ORDER — LACTATED RINGERS IV SOLN
INTRAVENOUS | Status: DC
  Administered 2019-02-28 (×2): via INTRAVENOUS

## 2019-02-28 MED ORDER — ACETAMINOPHEN 500 MG PO TABS: 500.0000 mg | ORAL_TABLET | Freq: Once | ORAL | Status: DC

## 2019-02-28 MED ORDER — EPHEDRINE SULFATE-NACL 50-0.9 MG/10ML-% IV SOSY
PREFILLED_SYRINGE | INTRAVENOUS | Status: DC | PRN
  Administered 2019-02-28: 10 mg via INTRAVENOUS

## 2019-02-28 MED ORDER — FENTANYL CITRATE (PF) 100 MCG/2ML IJ SOLN
25.0000 ug | INTRAMUSCULAR | Status: DC | PRN
  Administered 2019-02-28 (×3): 25 ug via INTRAVENOUS

## 2019-02-28 MED ORDER — CEFAZOLIN SODIUM-DEXTROSE 2-4 GM/100ML-% IV SOLN
INTRAVENOUS | Status: AC
  Filled 2019-02-28: qty 100

## 2019-02-28 MED ORDER — CEFAZOLIN SODIUM-DEXTROSE 2-4 GM/100ML-% IV SOLN
2.0000 g | INTRAVENOUS | Status: AC
  Administered 2019-02-28: 2 g via INTRAVENOUS

## 2019-02-28 MED ORDER — MENTHOL 3 MG MT LOZG: 1.0000 | LOZENGE | OROMUCOSAL | Status: DC | PRN

## 2019-02-28 MED ORDER — ONDANSETRON HCL 4 MG/2ML IJ SOLN
INTRAMUSCULAR | Status: DC | PRN
  Administered 2019-02-28: 4 mg via INTRAVENOUS

## 2019-02-28 MED ORDER — ONDANSETRON HCL 4 MG PO TABS: 4.0000 mg | ORAL_TABLET | Freq: Four times a day (QID) | ORAL | Status: DC | PRN

## 2019-02-28 MED ORDER — LACTATED RINGERS IV SOLN: INTRAVENOUS | Status: DC

## 2019-02-28 MED ORDER — DEXAMETHASONE SODIUM PHOSPHATE 10 MG/ML IJ SOLN
INTRAMUSCULAR | Status: DC | PRN
  Administered 2019-02-28: 10 mg via INTRAVENOUS

## 2019-02-28 MED ORDER — HYDROMORPHONE HCL 1 MG/ML IJ SOLN: 0.5000 mg | INTRAMUSCULAR | Status: DC | PRN

## 2019-02-28 MED ORDER — POLYETHYLENE GLYCOL 3350 17 G PO PACK: 17.0000 g | PACK | Freq: Every day | ORAL | Status: DC | PRN

## 2019-02-28 MED ORDER — LIDOCAINE 2% (20 MG/ML) 5 ML SYRINGE
INTRAMUSCULAR | Status: DC | PRN
  Administered 2019-02-28: 50 mg via INTRAVENOUS

## 2019-02-28 MED ORDER — SUCCINYLCHOLINE CHLORIDE 200 MG/10ML IV SOSY
PREFILLED_SYRINGE | INTRAVENOUS | Status: AC
  Filled 2019-02-28: qty 10

## 2019-02-28 MED ORDER — ACETAMINOPHEN 500 MG PO TABS
ORAL_TABLET | ORAL | Status: AC
  Filled 2019-02-28: qty 1

## 2019-02-28 MED ORDER — OXYCODONE HCL 5 MG PO TABS
10.0000 mg | ORAL_TABLET | ORAL | Status: DC | PRN
  Administered 2019-03-01 (×2): 10 mg via ORAL
  Filled 2019-02-28 (×2): qty 2

## 2019-02-28 MED ORDER — KETOROLAC TROMETHAMINE 30 MG/ML IJ SOLN
INTRAMUSCULAR | Status: AC
  Filled 2019-02-28: qty 1

## 2019-02-28 MED ORDER — FENTANYL CITRATE (PF) 100 MCG/2ML IJ SOLN
INTRAMUSCULAR | Status: AC
  Filled 2019-02-28: qty 2

## 2019-02-28 MED ORDER — METHOCARBAMOL 500 MG PO TABS: 500.0000 mg | ORAL_TABLET | Freq: Four times a day (QID) | ORAL | Status: DC | PRN

## 2019-02-28 MED ORDER — ONDANSETRON HCL 4 MG/2ML IJ SOLN: 4.0000 mg | Freq: Four times a day (QID) | INTRAMUSCULAR | Status: DC | PRN

## 2019-02-28 MED ORDER — METOCLOPRAMIDE HCL 5 MG/ML IJ SOLN: 5.0000 mg | Freq: Three times a day (TID) | INTRAMUSCULAR | Status: DC | PRN

## 2019-02-28 MED ORDER — ACETAMINOPHEN 325 MG PO TABS: 325.0000 mg | ORAL_TABLET | Freq: Four times a day (QID) | ORAL | Status: DC | PRN

## 2019-02-28 MED ORDER — DOCUSATE SODIUM 100 MG PO CAPS
100.0000 mg | ORAL_CAPSULE | Freq: Two times a day (BID) | ORAL | Status: DC
  Administered 2019-02-28 – 2019-03-01 (×2): 100 mg via ORAL
  Filled 2019-02-28 (×2): qty 1

## 2019-02-28 MED ORDER — ONDANSETRON HCL 4 MG/2ML IJ SOLN
INTRAMUSCULAR | Status: AC
  Filled 2019-02-28: qty 4

## 2019-02-28 MED ORDER — OXYCODONE HCL 5 MG PO TABS: 5.0000 mg | ORAL_TABLET | ORAL | Status: DC | PRN

## 2019-02-28 MED ORDER — DIPHENHYDRAMINE HCL 12.5 MG/5ML PO ELIX: 12.5000 mg | ORAL_SOLUTION | ORAL | Status: DC | PRN

## 2019-02-28 MED ORDER — BUPIVACAINE HCL (PF) 0.5 % IJ SOLN
INTRAMUSCULAR | Status: DC | PRN
  Administered 2019-02-28: 15 mL via PERINEURAL

## 2019-02-28 MED ORDER — MAGNESIUM CITRATE PO SOLN: 1.0000 | Freq: Once | ORAL | Status: DC | PRN

## 2019-02-28 MED ORDER — METOCLOPRAMIDE HCL 5 MG PO TABS: 5.0000 mg | ORAL_TABLET | Freq: Three times a day (TID) | ORAL | Status: DC | PRN

## 2019-02-28 MED ORDER — TRAMADOL HCL 50 MG PO TABS: 50.0000 mg | ORAL_TABLET | Freq: Four times a day (QID) | ORAL | Status: DC | PRN

## 2019-02-28 MED ORDER — SUCCINYLCHOLINE CHLORIDE 200 MG/10ML IV SOSY
PREFILLED_SYRINGE | INTRAVENOUS | Status: DC | PRN
  Administered 2019-02-28: 120 mg via INTRAVENOUS

## 2019-02-28 MED ORDER — DEXAMETHASONE SODIUM PHOSPHATE 10 MG/ML IJ SOLN
INTRAMUSCULAR | Status: AC
  Filled 2019-02-28: qty 2

## 2019-02-28 MED ORDER — ONDANSETRON HCL 4 MG/2ML IJ SOLN
INTRAMUSCULAR | Status: AC
  Filled 2019-02-28: qty 2

## 2019-02-28 SURGICAL SUPPLY — 76 items
BIT DRILL 2.8 (BIT) ×1
BIT DRILL CANN QC 2.8X165 (BIT) IMPLANT
BIT DRILL PERC QC 2.8X200 100 (BIT) IMPLANT
COVER SURGICAL LIGHT HANDLE (MISCELLANEOUS) ×3 IMPLANT
COVER WAND RF STERILE (DRAPES) ×3 IMPLANT
DERMABOND ADVANCED (GAUZE/BANDAGES/DRESSINGS) ×2
DERMABOND ADVANCED .7 DNX12 (GAUZE/BANDAGES/DRESSINGS) IMPLANT
DRAPE C-ARM 42X72 X-RAY (DRAPES) ×3 IMPLANT
DRAPE INCISE IOBAN 66X45 STRL (DRAPES) ×3 IMPLANT
DRAPE ORTHO SPLIT 77X108 STRL (DRAPES) ×4
DRAPE SURG ORHT 6 SPLT 77X108 (DRAPES) ×2 IMPLANT
DRAPE U-SHAPE 47X51 STRL (DRAPES) ×3 IMPLANT
DRILL BIT 2.8MM (BIT) ×2
DRILL BIT QUICK COUP 2.8MM 100 (BIT) ×2
DRSG AQUACEL AG ADV 3.5X10 (GAUZE/BANDAGES/DRESSINGS) ×3 IMPLANT
DURAPREP 26ML APPLICATOR (WOUND CARE) ×3 IMPLANT
ELECT REM PT RETURN 9FT ADLT (ELECTROSURGICAL) ×3
ELECTRODE REM PT RTRN 9FT ADLT (ELECTROSURGICAL) ×1 IMPLANT
GLOVE BIO SURGEON STRL SZ7.5 (GLOVE) ×3 IMPLANT
GLOVE BIO SURGEON STRL SZ8 (GLOVE) ×3 IMPLANT
GLOVE EUDERMIC 7 POWDERFREE (GLOVE) ×6 IMPLANT
GLOVE SS BIOGEL STRL SZ 7.5 (GLOVE) ×2 IMPLANT
GLOVE SUPERSENSE BIOGEL SZ 7.5 (GLOVE) ×4
GOWN STRL REUS W/ TWL LRG LVL3 (GOWN DISPOSABLE) ×1 IMPLANT
GOWN STRL REUS W/ TWL XL LVL3 (GOWN DISPOSABLE) ×2 IMPLANT
GOWN STRL REUS W/TWL LRG LVL3 (GOWN DISPOSABLE) ×2
GOWN STRL REUS W/TWL XL LVL3 (GOWN DISPOSABLE) ×4
HUMERUS PROX LCP 8H 3.5X196 (Shoulder) ×2 IMPLANT
KIT BASIN OR (CUSTOM PROCEDURE TRAY) ×3 IMPLANT
KIT TURNOVER KIT B (KITS) ×6 IMPLANT
MANIFOLD NEPTUNE II (INSTRUMENTS) ×3 IMPLANT
NDL SUT .5 MAYO 1.404X.05X (NEEDLE) IMPLANT
NEEDLE 22X1 1/2 (OR ONLY) (NEEDLE) ×3 IMPLANT
NEEDLE MAYO TAPER (NEEDLE)
NS IRRIG 1000ML POUR BTL (IV SOLUTION) ×3 IMPLANT
PACK SHOULDER (CUSTOM PROCEDURE TRAY) ×3 IMPLANT
PAD ARMBOARD 7.5X6 YLW CONV (MISCELLANEOUS) ×6 IMPLANT
PAD ORTHO SHOULDER 7X19 LRG (SOFTGOODS) ×3 IMPLANT
RESTRAINT HEAD UNIVERSAL NS (MISCELLANEOUS) ×3 IMPLANT
SCREW CORTEX 3.5 22MM (Screw) ×2 IMPLANT
SCREW CORTEX 3.5 24MM (Screw) ×2 IMPLANT
SCREW CORTEX 3.5 26MM (Screw) ×4 IMPLANT
SCREW CORTEX 3.5 28MM (Screw) ×4 IMPLANT
SCREW LOCK CORT ST 3.5X22 (Screw) IMPLANT
SCREW LOCK CORT ST 3.5X24 (Screw) IMPLANT
SCREW LOCK CORT ST 3.5X26 (Screw) IMPLANT
SCREW LOCK CORT ST 3.5X28 (Screw) IMPLANT
SCREW LOCK T15 FT 20X3.5XST (Screw) IMPLANT
SCREW LOCK T15 FT 22X3.5XST (Screw) IMPLANT
SCREW LOCK T15 FT 30X3.5X2.9X (Screw) IMPLANT
SCREW LOCK T15 FT 36X3.5X2.9X (Screw) IMPLANT
SCREW LOCK T15 FT 38X3.5XST (Screw) IMPLANT
SCREW LOCK T15 FT 40X3.5XST (Screw) IMPLANT
SCREW LOCKING 3.5X20 (Screw) ×2 IMPLANT
SCREW LOCKING 3.5X22 (Screw) ×2 IMPLANT
SCREW LOCKING 3.5X26 (Screw) ×2 IMPLANT
SCREW LOCKING 3.5X30 (Screw) ×4 IMPLANT
SCREW LOCKING 3.5X34 (Screw) ×2 IMPLANT
SCREW LOCKING 3.5X36 (Screw) ×2 IMPLANT
SCREW LOCKING 3.5X38 (Screw) ×2 IMPLANT
SCREW LOCKING 3.5X40 (Screw) ×2 IMPLANT
SLING ARM FOAM STRAP MED (SOFTGOODS) ×2 IMPLANT
SPONGE LAP 18X18 RF (DISPOSABLE) ×3 IMPLANT
SUCTION FRAZIER HANDLE 10FR (MISCELLANEOUS) ×2
SUCTION TUBE FRAZIER 10FR DISP (MISCELLANEOUS) ×1 IMPLANT
SUT FIBERWIRE #2 38 T-5 BLUE (SUTURE)
SUT MNCRL AB 3-0 PS2 18 (SUTURE) ×3 IMPLANT
SUT VIC AB 1 CT1 27 (SUTURE) ×2
SUT VIC AB 1 CT1 27XBRD ANTBC (SUTURE) ×1 IMPLANT
SUT VIC AB 2-0 CT1 27 (SUTURE)
SUT VIC AB 2-0 CT1 TAPERPNT 27 (SUTURE) IMPLANT
SUTURE FIBERWR #2 38 T-5 BLUE (SUTURE) IMPLANT
SYR CONTROL 10ML LL (SYRINGE) ×3 IMPLANT
TOWEL OR 17X24 6PK STRL BLUE (TOWEL DISPOSABLE) ×3 IMPLANT
TOWEL OR 17X26 10 PK STRL BLUE (TOWEL DISPOSABLE) ×3 IMPLANT
WATER STERILE IRR 1000ML POUR (IV SOLUTION) ×3 IMPLANT

## 2019-02-28 NOTE — Transfer of Care (Signed)
Immediate Anesthesia Transfer of Care Note  Patient: Sonya Fowler  Procedure(s) Performed: OPEN REDUCTION INTERNAL FIXATION (ORIF) PROXIMAL HUMERUS FRACTURE (Left Arm Upper)  Patient Location: PACU  Anesthesia Type:GA combined with regional for post-op pain  Level of Consciousness: drowsy and patient cooperative  Airway & Oxygen Therapy: Patient Spontanous Breathing  Post-op Assessment: Report given to RN and Post -op Vital signs reviewed and stable  Post vital signs: Reviewed and stable  Last Vitals:  Vitals Value Taken Time  BP    Temp    Pulse 93 02/28/2019 12:44 PM  Resp 15 02/28/2019 12:44 PM  SpO2 97 % 02/28/2019 12:44 PM  Vitals shown include unvalidated device data.  Last Pain:  Vitals:   02/28/19 0809  TempSrc:   PainSc: 9       Patients Stated Pain Goal: 4 (02/28/19 0809)  Complications: No apparent anesthesia complications

## 2019-02-28 NOTE — Progress Notes (Signed)
Spoke with Dr. Rennis Chris about Penicillin allergy - Dr Rennis Chris wants the patient to receive Ancef 2g

## 2019-02-28 NOTE — Anesthesia Procedure Notes (Signed)
Procedure Name: Intubation Date/Time: 02/28/2019 10:04 AM Performed by: Rosiland Oz, CRNA Pre-anesthesia Checklist: Patient identified, Emergency Drugs available, Suction available, Patient being monitored and Timeout performed Patient Re-evaluated:Patient Re-evaluated prior to induction Oxygen Delivery Method: Circle system utilized Preoxygenation: Pre-oxygenation with 100% oxygen Induction Type: IV induction and Rapid sequence Laryngoscope Size: Miller and 2 Grade View: Grade I Tube type: Oral Tube size: 7.0 mm Number of attempts: 1 Airway Equipment and Method: Stylet Placement Confirmation: ETT inserted through vocal cords under direct vision,  positive ETCO2 and breath sounds checked- equal and bilateral Secured at: 21 cm Tube secured with: Tape Dental Injury: Teeth and Oropharynx as per pre-operative assessment

## 2019-02-28 NOTE — Anesthesia Postprocedure Evaluation (Signed)
Anesthesia Post Note  Patient: FARDOWSA SANDBOTHE  Procedure(s) Performed: OPEN REDUCTION INTERNAL FIXATION (ORIF) PROXIMAL HUMERUS FRACTURE (Left Arm Upper)     Patient location during evaluation: PACU Anesthesia Type: Regional and General Level of consciousness: awake and alert Pain management: pain level controlled Vital Signs Assessment: post-procedure vital signs reviewed and stable Respiratory status: spontaneous breathing, nonlabored ventilation, respiratory function stable and patient connected to nasal cannula oxygen Cardiovascular status: blood pressure returned to baseline and stable Postop Assessment: no apparent nausea or vomiting Anesthetic complications: no    Last Vitals:  Vitals:   02/28/19 1654 02/28/19 1709  BP:  129/69  Pulse: 78 83  Resp:  14  Temp:  36.5 C  SpO2: 98% 100%    Last Pain:  Vitals:   02/28/19 1709  TempSrc: Oral  PainSc:                  Ryott Rafferty L Yordin Rhoda

## 2019-02-28 NOTE — Plan of Care (Signed)
  Problem: Activity: Goal: Ability to avoid complications of mobility impairment will improve Outcome: Progressing Goal: Ability to tolerate increased activity will improve Outcome: Progressing   Problem: Education: Goal: Verbalization of understanding the information provided will improve Outcome: Progressing   Problem: Coping: Goal: Level of anxiety will decrease Outcome: Progressing   Problem: Physical Regulation: Goal: Postoperative complications will be avoided or minimized Outcome: Progressing   Problem: Respiratory: Goal: Ability to maintain a clear airway will improve Outcome: Progressing   Problem: Pain Management: Goal: Pain level will decrease Outcome: Progressing   Problem: Skin Integrity: Goal: Signs of wound healing will improve Outcome: Progressing   Problem: Tissue Perfusion: Goal: Ability to maintain adequate tissue perfusion will improve Outcome: Progressing   

## 2019-02-28 NOTE — Op Note (Signed)
02/28/2019  12:31 PM  PATIENT:   Sonya Fowler  68 y.o. female  PRE-OPERATIVE DIAGNOSIS: Comminuted, displaced left proximal humerus shaft fracture  POST-OPERATIVE DIAGNOSIS: Same  PROCEDURE: Open reduction and internal fixation left proximal humeral shaft fracture  SURGEON:  Lolah Coghlan, Vania Rea M.D.  ASSISTANTS: Ralene Bathe, PA-C  ANESTHESIA:   General endotracheal and interscalene block with Exparel  EBL: 150 cc  SPECIMEN: None  Drains: None   PATIENT DISPOSITION:  PACU - hemodynamically stable.    PLAN OF CARE: Admit for overnight observation  Brief history:  Sonya Fowler had a ground-level fall sustaining a comminuted and displaced and angulated left proximal humeral shaft fracture with fracture line extension up into the humeral metaphyseal region.  Due to the degree of comminution and displacement and marked instability of the fracture pattern she is brought to the operating this time for planned open reduction and internal fixation.  Preoperatively had counseled Sonya Fowler regarding treatment options as well as the potential risks versus benefits thereof.  Possible surgical complications were reviewed including bleeding, infection, neurovascular injury, malunion, nonunion, loss of fixation, anesthetic complication, and possible need for additional surgery.  She understands and accepts and agrees with our planned procedure.  Procedure in detail:  After undergoing routine preop evaluation patient received prophylactic antibiotics and interscalene block with Exparel was established in the holding area by the anesthesia department.  Placed supine on the operating table and underwent the smooth induction of a general endotracheal anesthesia.  In the supine position on the radiolucent fracture table the left shoulder girdle left upper extremity was then sterilely prepped and draped in standard fashion.  Timeout was called.  An anterior deltopectoral and then anterior humeral  shaft incision was made approximate 25 cm in length beginning at the coracoid and extending distally.  Skin flaps elevated dissection carried deeply proximally the deltopectoral interval was developed with the vein taken laterally and then more distally we continued in the interval and retracted the bicep musculature immediately and expose the anterior cortex of the humeral shaft and the distal aspect we did split the brachialis in the midline to gain exposure of the shaft distally.  We used subperiosteal dissection to expose the fracture site and the humeral shaft distally.  The fracture pattern had 1 dominant butterfly fragment laterally and the fracture pattern was markedly unstable and there was was very challenging to reassembled the fracture and to the proper alignment.  Provisional alignment and fixation was clamped and fluoroscopic imaging then used to confirm good overall alignment.  We then selected the 8 hole Synthes proximal humeral plate this was then applied over the lateral margin of the humeral head extending anterolaterally along the humeral shaft with proper positioning confirmed by direct visualization and fluoroscopic imaging.  With the plate position and the overall fracture alignment reduced we then performed provisional fixation both proximally and distally and then we were convinced and that the overall alignment has been maintained we went ahead and completed fixation with multiple locking screws up into the humeral head a lag screw across the fracture site through the plate and transfixing the butterfly fragment and then lag screws and multiple locking screws distally as well.  Overall the fixation felt solid with good quality bone and were overall very pleased with the construct and alignment.  Final images were then obtained at the completion of the case.  The wound was then copiously irrigated.  Hemostasis was obtained.  The brachialis was then closed distally over the  plate and then the  distal aspect of the deltopectoral interval insertion was repaired as well with #1 Vicryl and then deep fascial planes reapproximated with a series of again #1 Vicryl sutures.  2-0 Vicryl used for the subcu layers and intracuticular 3 Monocryl for the skin followed by Dermabond and Aquasol dressing left arm was then placed into a sling immobilizer and the patient was awakened extubated taken to the recovery room in stable condition.  Ralene Bathe, PA-C was used as an Geophysicist/field seismologist throughout this case essential for help with positioning of the patient, positioning of the extremity, manipulation of the fracture, wound closure, and intraoperative decision making.  Vania Rea Rigby Leonhardt MD   Contact # [phone]

## 2019-02-28 NOTE — Anesthesia Procedure Notes (Signed)
Anesthesia Regional Block: Interscalene brachial plexus block   Pre-Anesthetic Checklist: ,, timeout performed, Correct Patient, Correct Site, Correct Laterality, Correct Procedure, Correct Position, site marked, Risks and benefits discussed,  Surgical consent,  Pre-op evaluation,  At surgeon's request and post-op pain management  Laterality: Left  Prep: Maximum Sterile Barrier Precautions used, chloraprep       Needles:  Injection technique: Single-shot  Needle Type: Echogenic Stimulator Needle     Needle Length: 5cm  Needle Gauge: 22     Additional Needles:   Procedures:,,,, ultrasound used (permanent image in chart),,,,  Narrative:  Start time: 02/28/2019 8:35 AM End time: 02/28/2019 8:45 AM Injection made incrementally with aspirations every 5 mL.  Performed by: Personally  Anesthesiologist: Elmer Picker, MD  Additional Notes: Monitors applied. No increased pain on injection. No increased resistance to injection. Injection made in 5cc increments. Good needle visualization. Patient tolerated procedure well.

## 2019-02-28 NOTE — Anesthesia Preprocedure Evaluation (Addendum)
Anesthesia Evaluation  Patient identified by MRN, date of birth, ID band Patient awake    Reviewed: Allergy & Precautions, NPO status , Patient's Chart, lab work & pertinent test results  Airway Mallampati: II  TM Distance: >3 FB Neck ROM: Full    Dental no notable dental hx. (+) Teeth Intact, Dental Advisory Given   Pulmonary neg pulmonary ROS,    Pulmonary exam normal breath sounds clear to auscultation       Cardiovascular negative cardio ROS Normal cardiovascular exam Rhythm:Regular Rate:Normal  Stress Test 2018 - Stress ECG conclusions: There were no stress arrhythmias or   conduction abnormalities. Upsloping ST depression noted in leads II, III, aVF, V4, V5, V6. The stress ECG was negative for ischemia. This score predicts a low risk of cardiac events. - Staged echo: There was no echocardiographic evidence for stress-induced ischemia. - Negative for ischemia.   Neuro/Psych PSYCHIATRIC DISORDERS Depression negative neurological ROS     GI/Hepatic negative GI ROS, Neg liver ROS,   Endo/Other  negative endocrine ROS  Renal/GU negative Renal ROS  negative genitourinary   Musculoskeletal  (+) Arthritis , Rheumatoid disorders,    Abdominal   Peds  Hematology negative hematology ROS (+)   Anesthesia Other Findings   Reproductive/Obstetrics negative OB ROS                           Anesthesia Physical Anesthesia Plan  ASA: II  Anesthesia Plan: General and Regional   Post-op Pain Management:  Regional for Post-op pain   Induction: Intravenous  PONV Risk Score and Plan: 3 and Midazolam, Dexamethasone and Ondansetron  Airway Management Planned: Oral ETT  Additional Equipment:   Intra-op Plan:   Post-operative Plan: Extubation in OR  Informed Consent: I have reviewed the patients History and Physical, chart, labs and discussed the procedure including the risks, benefits and  alternatives for the proposed anesthesia with the patient or authorized representative who has indicated his/her understanding and acceptance.     Dental advisory given  Plan Discussed with: CRNA  Anesthesia Plan Comments:         Anesthesia Quick Evaluation

## 2019-02-28 NOTE — H&P (Signed)
[name]    Chief Complaint: left proximal humerus fracture HPI: The patient is a 68 y.o. female with past medical history of rheumatoid arthritis, status post ground level fall sustaining a significantly comminuted and angulated left proximal humeral shaft fracture.  Patient is brought to the operating room at this time for planned open reduction and internal fixation  Past Medical History:  Diagnosis Date  . Chest pain   . Depression    situational (PMH)  . Family history of adverse reaction to anesthesia    Son had " blue extremities and was sluggish waking up after throat surgery ."  . Leg pain, right   . Osteopenia   . Proximal humerus fracture    displaced left  . Rheumatoid arthritis (HCC)   . UTI (urinary tract infection)   . Wears glasses     Past Surgical History:  Procedure Laterality Date  . COLONOSCOPY    . KNEE ARTHROSCOPY Right   . PARTIAL HYSTERECTOMY    . TONSILLECTOMY    . WISDOM TOOTH EXTRACTION    . WISDOM TOOTH EXTRACTION      Family History  Problem Relation Age of Onset  . Valvular heart disease Mother   . Atrial fibrillation Mother   . Hypertension Father   . Heart disease Father   . Heart disease Sister   . Hypertension Sister   . Heart disease Brother   . Hypertension Brother   . Hypertension Brother     Social History:  reports that she has never smoked. She has never used smokeless tobacco. She reports current alcohol use. She reports that she does not use drugs.   Medications Prior to Admission  Medication Sig Dispense Refill  . calcium-vitamin D (OSCAL WITH D) 500-200 MG-UNIT tablet Take 1 tablet by mouth at bedtime.    . cetirizine (ZYRTEC) 10 MG tablet Take 10 mg by mouth daily.    Marland Kitchen estrogens, conjugated, (PREMARIN) 0.3 MG tablet Take 0.3 mg by mouth daily.     Marland Kitchen etanercept (ENBREL) 50 MG/ML injection Inject 50 mg into the skin every Friday.     . folic acid (FOLVITE) 1 MG tablet Take 3 mg by mouth daily.     . magnesium  oxide (MAG-OX) 400 MG tablet Take 400 mg by mouth at bedtime.    . meloxicam (MOBIC) 15 MG tablet Take 15 mg by mouth daily.    . methotrexate (RHEUMATREX) 2.5 MG tablet Take 8 tablets by mouth every Tuesday.     . Omega-3 Fatty Acids (FISH OIL) 1000 MG CAPS Take 1,000 mg by mouth daily.    Marland Kitchen OVER THE COUNTER MEDICATION Take 500 mg by mouth daily. Black Cumin Seed Oil 500 mg Capsule    . oxyCODONE (OXY IR/ROXICODONE) 5 MG immediate release tablet Take 1-2 tablets (5-10 mg total) by mouth every 4 (four) hours as needed for severe pain. 30 tablet 0  . Turmeric 500 MG CAPS Take 500 mg by mouth daily.    Marland Kitchen denosumab (PROLIA) 60 MG/ML SOSY injection Inject 60 mg into the skin every 6 (six) months.       Physical Exam: Patient has been sedated for the application of her interscalene block.  However she is oriented and cooperative.  Left upper extremity has a coaptation splint applied.  Skin is grossly intact.  Prior examination demonstrated that she was grossly neurovascular intact in the left upper extremity.  Her radiographs are reviewed which again show a comminuted and moderately angulated  proximal humeral shaft fracture on the left with a large butterfly fragment and extension up into the metaphyseal region.  Vitals  Temp:  [98.8 F (37.1 C)] 98.8 F (37.1 C) (04/16 0753) Pulse Rate:  [73-81] 80 (04/16 0905) Resp:  [11-20] 17 (04/16 0905) BP: (98-140)/(53-67) 131/67 (04/16 0905) SpO2:  [96 %-100 %] 100 % (04/16 0905) Weight:  [56.2 kg] 56.2 kg (04/16 0744)  Assessment/Plan  Impression: left proximal humerus fracture  Plan of Action: Procedure(s): OPEN REDUCTION INTERNAL FIXATION (ORIF) PROXIMAL HUMERUS FRACTURE  Icarus Partch M Cliff Damiani 02/28/2019, 9:31 AM Contact # [phone]

## 2019-03-01 ENCOUNTER — Encounter (HOSPITAL_COMMUNITY): Payer: Self-pay | Admitting: Orthopedic Surgery

## 2019-03-01 DIAGNOSIS — S42292A Other displaced fracture of upper end of left humerus, initial encounter for closed fracture: Secondary | ICD-10-CM | POA: Diagnosis not present

## 2019-03-01 MED ORDER — CYCLOBENZAPRINE HCL 10 MG PO TABS: 10.0000 mg | ORAL_TABLET | Freq: Three times a day (TID) | ORAL | 1 refills | Status: AC | PRN

## 2019-03-01 MED ORDER — TRAMADOL HCL 50 MG PO TABS: 50.0000 mg | ORAL_TABLET | Freq: Four times a day (QID) | ORAL | 0 refills | Status: AC | PRN

## 2019-03-01 MED ORDER — ONDANSETRON HCL 4 MG PO TABS: 4.0000 mg | ORAL_TABLET | Freq: Three times a day (TID) | ORAL | 0 refills | Status: AC | PRN

## 2019-03-01 MED ORDER — OXYCODONE HCL 5 MG PO TABS: 5.0000 mg | ORAL_TABLET | ORAL | 0 refills | Status: AC | PRN

## 2019-03-01 NOTE — Discharge Instructions (Signed)
Vania Rea. Supple, M.D., F.A.A.O.S. Orthopaedic Surgery Specializing in Arthroscopic and Reconstructive Surgery of the Shoulder [phone] [address] [place], [place] [address] - Fax [phone]   POST-OP INSTRUCTIONS  1. Call the office at [phone] to schedule your first post-op appointment 10-14 days from the date of your surgery.  2. The bandage over your incision is waterproof. You may begin showering with this dressing on. You may leave this dressing on until first follow up appointment within 2 weeks. We prefer you leave this dressing in place until follow up however after 5-7 days if you are having itching or skin irritation and would like to remove it you may do so. Go slow and tug at the borders gently to break the bond the dressing has with the skin. At this point if there is no drainage it is okay to go without a bandage or you may cover it with a light guaze and tape. You can also expect significant bruising around your shoulder that will drift down your arm and into your chest wall. This is very normal and should resolve over several days.   3. Wear your sling/immobilizer at all times except to perform the exercises below or to occasionally let your arm dangle by your side to stretch your elbow. You also need to sleep in your sling immobilizer until instructed otherwise. It is ok to remove your sling if you are sitting in a controlled environment and allow your arm to rest in a position of comfort by your side or on your lap with pillows to give your neck and skin a break from the sling. You may remove it to allow arm to dangle by side to shower. If you are up walking around and when you go to sleep at night you need to wear it.  4. Range of motion to your elbow, wrist, and hand are encouraged 3-5 times daily. Exercise to your hand and fingers helps to reduce swelling you may experience.  5. Utilize ice to the shoulder 3-5 times minimum a day and additionally if  you are experiencing pain.  6. Prescriptions for a pain medication and a muscle relaxant are provided for you. It is recommended that if you are experiencing pain that you pain medication alone is not controlling, add the muscle relaxant along with the pain medication which can give additional pain relief. The first 1-2 days is generally the most severe of your pain and then should gradually decrease. As your pain lessens it is recommended that you decrease your use of the pain medications to an "as needed basis'" only and to always comply with the recommended dosages of the pain medications.  7. Pain medications can produce constipation along with their use. If you experience this, the use of an over the counter stool softener or laxative daily is recommended.   8. For additional questions or concerns, please do not hesitate to call the office. If after hours there is an answering service to forward your concerns to the physician on call.  9.Pain control following an exparel block  To help control your post-operative pain you received a nerve block  performed with Exparel which is a long acting anesthetic (numbing agent) which can provide pain relief and sensations of numbness (and relief of pain) in the operative shoulder and arm for up to 3 days. Sometimes it provides mixed relief, meaning you may still have numbness in certain areas of the arm but can still be able to  move  parts of that arm, hand, and fingers. We recommend that your prescribed pain medications  be used as needed. We do not feel it is necessary to "pre medicate" and "stay ahead" of pain.  Taking narcotic pain medications when you are not having any pain can lead to unnecessary and potentially dangerous side effects.    POST-OP EXERCISES  Ok to allow arm to dangle and move elbow wrist and hand  Ok for gentle pendulums and gentle shoulder ROM

## 2019-03-01 NOTE — Progress Notes (Signed)
Discharge instructions completed with pt.  Pt verbalized understanding of the information.  Pt denies chest pain, shortness of breath, dizziness, lightheadedness, and n/v.  Pt's IV discontinued.  Pt medicated for pain.  Pt discharged home.

## 2019-03-01 NOTE — Plan of Care (Signed)
  Problem: Pain Management: Goal: Pain level will decrease Outcome: Progressing   

## 2019-03-01 NOTE — Evaluation (Signed)
Occupational Therapy Evaluation Patient Details Name: Sonya Fowler MRN: [id number] DOB: Nov 05, 1951 Today's Date: 03/01/2019    History of Present Illness 68 yo female s/p fall with ORIF L UE PMH  , Depression, Family history of adverse reaction to anesthesia, Leg pain, right, Osteopenia, Proximal humerus fracture, Rheumatoid arthritis (HCC), UTI (urinary tract infection), and Wears glasses.    Clinical Impression   Patient evaluated by Occupational Therapy with no further acute OT needs identified. All education has been completed and the patient has no further questions. See below for any follow-up Occupational Therapy or equipment needs. OT to sign off. Thank you for referral.      Follow Up Recommendations  Follow surgeon's recommendation for DC plan and follow-up therapies    Equipment Recommendations  None recommended by OT    Recommendations for Other Services       Precautions / Restrictions Precautions Precautions: Shoulder Type of Shoulder Precautions: PROM only per orders. Er 10, ABduction 45 and Flexion 60 degrees Restrictions LUE Weight Bearing: Non weight bearing      Mobility Bed Mobility Overal bed mobility: Independent                Transfers Overall transfer level: Needs assistance   Transfers: Sit to/from Stand Sit to Stand: Supervision              Balance                                           ADL either performed or assessed with clinical judgement   ADL Overall ADL's : Needs assistance/impaired Eating/Feeding: Independent   Grooming: Wash/dry hands   Upper Body Bathing: Minimal assistance   Lower Body Bathing: Minimal assistance   Upper Body Dressing : Minimal assistance   Lower Body Dressing: Minimal assistance   Toilet Transfer: Supervision/safety   Toileting- Clothing Manipulation and Hygiene: Supervision/safety       Functional mobility during ADLs: Supervision/safety       Vision  Baseline Vision/History: Wears glasses Wears Glasses: Reading only       Perception     Praxis      Pertinent Vitals/Pain Pain Assessment: Faces Faces Pain Scale: Hurts little more Pain Location: L UE Pain Descriptors / Indicators: Guarding;Grimacing Pain Intervention(s): Monitored during session;Premedicated before session;Repositioned     Hand Dominance Right   Extremity/Trunk Assessment Upper Extremity Assessment Upper Extremity Assessment: LUE deficits/detail LUE Deficits / Details: s/p fall with nerve block remaining in place       Cervical / Trunk Assessment Cervical / Trunk Assessment: Normal   Communication Communication Communication: No difficulties   Cognition Arousal/Alertness: Awake/alert Behavior During Therapy: WFL for tasks assessed/performed Overall Cognitive Status: Within Functional Limits for tasks assessed                                     General Comments  educated on shoulder d/c protocol    Exercises Exercises: Shoulder   Shoulder Instructions Shoulder Instructions Donning/doffing shirt without moving shoulder: Supervision/safety Method for sponge bathing under operated UE: Supervision/safety Donning/doffing sling/immobilizer: Supervision/safety Correct positioning of sling/immobilizer: Supervision/safety ROM for elbow, wrist and digits of operated UE: Supervision/safety Sling wearing schedule (on at all times/off for ADL's): Supervision/safety Positioning of UE while sleeping: Supervision/safety    Home Living Family/patient expects to  be discharged to:: Private residence Living Arrangements: Spouse/significant other Available Help at Discharge: Family Type of Home: House       Home Layout: One level                          Prior Functioning/Environment Level of Independence: Independent        Comments: driving a nd providing meals to others         OT Problem List:        OT  Treatment/Interventions:      OT Goals(Current goals can be found in the care plan section)    OT Frequency:     Barriers to D/C:            Co-evaluation              AM-PAC OT "6 Clicks" Daily Activity     Outcome Measure Help from another person eating meals?: None Help from another person taking care of personal grooming?: None Help from another person toileting, which includes using toliet, bedpan, or urinal?: A Little Help from another person bathing (including washing, rinsing, drying)?: A Little Help from another person to put on and taking off regular upper body clothing?: None Help from another person to put on and taking off regular lower body clothing?: A Little 6 Click Score: 21   End of Session Nurse Communication: Mobility status;Precautions;Weight bearing status  Activity Tolerance: Patient tolerated treatment well Patient left: in chair;with call bell/phone within reach  OT Visit Diagnosis: Unsteadiness on feet (R26.81)                Time: 1120-1200 OT Time Calculation (min): 40 min Charges:  OT General Charges $OT Visit: 1 Visit OT Evaluation $OT Eval Moderate Complexity: 1 Mod OT Treatments $Self Care/Home Management : 8-22 mins   Mateo Flow, OTR/L  Acute Rehabilitation Services Pager: [phone] Office: [phone] .   Mateo Flow 03/01/2019, 2:23 PM

## 2019-03-01 NOTE — Discharge Summary (Signed)
PATIENT ID:      Sonya Fowler  MRN:     [id number] DOB/AGE:    04-24-1951 / 68 y.o.     DISCHARGE SUMMARY  ADMISSION DATE:    02/28/2019 DISCHARGE DATE:    ADMISSION DIAGNOSIS: left proximal humerus fracture Past Medical History:  Diagnosis Date  . Chest pain   . Depression    situational (PMH)  . Family history of adverse reaction to anesthesia    Son had " blue extremities and was sluggish waking up after throat surgery ."  . Leg pain, right   . Osteopenia   . Proximal humerus fracture    displaced left  . Rheumatoid arthritis (HCC)   . UTI (urinary tract infection)   . Wears glasses     DISCHARGE DIAGNOSIS:   Active Problems:   Humerus shaft fracture   PROCEDURE: Procedure(s): OPEN REDUCTION INTERNAL FIXATION (ORIF) PROXIMAL HUMERUS FRACTURE on 02/28/2019  CONSULTS:    HISTORY:  See H&P in chart.  HOSPITAL COURSE:  Sonya Fowler is a 68 y.o. admitted on 02/28/2019 with a diagnosis of left proximal humerus fracture.  They were brought to the operating room on 02/28/2019 and underwent Procedure(s): OPEN REDUCTION INTERNAL FIXATION (ORIF) PROXIMAL HUMERUS FRACTURE.    They were given perioperative antibiotics:  Anti-infectives (From admission, onward)   Start     Dose/Rate Route Frequency Ordered Stop   02/28/19 0750  ceFAZolin (ANCEF) 2-4 GM/100ML-% IVPB    Note to Pharmacy:  [facility]   : cabinet override      02/28/19 0750 02/28/19 1009   02/28/19 0730  ceFAZolin (ANCEF) IVPB 2g/100 mL premix     2 g 200 mL/hr over 30 Minutes Intravenous On call to O.R. 02/28/19 0725 02/28/19 1009    .  Patient underwent the above named procedure and tolerated it well. The following day they were hemodynamically stable and pain was controlled on oral analgesics. They were neurovascularly intact to the operative extremity. OT was ordered and worked with patient per protocol. They were medically and orthopaedically stable for discharge on .    DIAGNOSTIC  STUDIES:  RECENT RADIOGRAPHIC STUDIES :  Dg Shoulder Left  Result Date: 02/26/2019 CLINICAL DATA:  Recent fall with arm pain, initial encounter EXAM: LEFT SHOULDER - 2+ VIEW COMPARISON:  None. FINDINGS: Comminuted fracture of the proximal to mid left humerus is noted extending into the region of the surgical neck. The humeral head is rotated but does not appear dislocated. No other bony abnormality is seen. IMPRESSION: Comminuted proximal to mid left humeral fracture. Electronically Signed   By: Alcide Clever M.D.   On: 02/26/2019 01:30   Dg Humerus Left  Result Date: 02/28/2019 CLINICAL DATA:  Open reduction and internal fixation of proximal left humeral fracture. EXAM: DG C-ARM 61-120 MIN; LEFT HUMERUS - 2+ VIEW FLUOROSCOPY TIME:  28 seconds. COMPARISON:  Radiographs of February 26, 2019. FINDINGS: Four intraoperative fluoroscopic images demonstrate surgical internal fixation of proximal left humeral fracture. Good alignment of fracture components is noted. IMPRESSION: Fluoroscopic guidance provided during internal fixation of proximal left humeral fracture. Electronically Signed   By: Lupita Raider M.D.   On: 02/28/2019 12:20   Dg Humerus Left  Result Date: 02/26/2019 CLINICAL DATA:  Recent fall with left arm pain, initial encounter EXAM: LEFT HUMERUS - 2+ VIEW COMPARISON:  None. FINDINGS: Comminuted proximal to mid left humeral fracture with extension into the surgical neck. No other fracture is seen. IMPRESSION: Comminuted left humeral fracture.  Electronically Signed   By: Alcide CleverMark  Lukens M.D.   On: 02/26/2019 01:31   Dg C-arm 1-60 Min  Result Date: 02/28/2019 CLINICAL DATA:  Open reduction and internal fixation of proximal left humeral fracture. EXAM: DG C-ARM 61-120 MIN; LEFT HUMERUS - 2+ VIEW FLUOROSCOPY TIME:  28 seconds. COMPARISON:  Radiographs of February 26, 2019. FINDINGS: Four intraoperative fluoroscopic images demonstrate surgical internal fixation of proximal left humeral fracture. Good  alignment of fracture components is noted. IMPRESSION: Fluoroscopic guidance provided during internal fixation of proximal left humeral fracture. Electronically Signed   By: Lupita RaiderJames  Green Jr M.D.   On: 02/28/2019 12:20    RECENT VITAL SIGNS:   Patient Vitals for the past 24 hrs:  BP Temp Temp src Pulse Resp SpO2 Height Weight  03/01/19 0435 116/63 98.2 F (36.8 C) Oral 73 16 98 % - -  03/01/19 0018 116/64 98 F (36.7 C) - 82 14 98 % - -  02/28/19 2004 110/77 98.8 F (37.1 C) Oral 94 14 100 % - -  02/28/19 1709 129/69 97.7 F (36.5 C) Oral 83 14 100 % 5\' 4"  (1.626 m) 58.2 kg  02/28/19 1654 - - - 78 - 98 % - -  02/28/19 1415 (!) 101/55 - - 76 12 98 % - -  02/28/19 1400 (!) 104/54 97.6 F (36.4 C) - 76 11 98 % - -  02/28/19 1345 (!) 105/56 - - 78 12 97 % - -  02/28/19 1330 107/63 - - 90 11 98 % - -  02/28/19 1315 (!) 109/53 - - 81 12 97 % - -  02/28/19 1300 116/60 - - 85 16 97 % - -  02/28/19 1245 (!) 107/33 (!) 97 F (36.1 C) - 93 15 97 % - -  02/28/19 0905 131/67 - - 80 17 100 % - -  02/28/19 0900 120/61 - - 78 14 100 % - -  02/28/19 0855 113/67 - - 75 14 100 % - -  .  RECENT EKG RESULTS:    Orders placed or performed in visit on 12/12/16  . EKG 12-Lead    DISCHARGE INSTRUCTIONS:    DISCHARGE MEDICATIONS:   Allergies as of 03/01/2019      Reactions   Penicillin G Rash, Other (See Comments)   Did it involve swelling of the face/tongue/throat, SOB, or low BP? No Did it involve sudden or severe rash/hives, skin peeling, or any reaction on the inside of your mouth or nose? #  #  #  YES  #  #  #  Did you need to seek medical attention at a hospital or doctor's office? No When did it last happen?Childhood rxn If all above answers are "NO", may proceed with cephalosporin use. Pt tolerated 2g ancef on 02/28/19, no rash, no reaction noted   Ciprofloxacin Hcl    Avoid all fluoroquinolones cause tendon rupture; pt has RA.   Morphine Itching   Sulfamethoxazole Nausea And Vomiting       Medication List    STOP taking these medications   Enbrel 50 MG/ML injection Generic drug:  etanercept   methotrexate 2.5 MG tablet Commonly known as:  RHEUMATREX     TAKE these medications   calcium-vitamin D 500-200 MG-UNIT tablet Commonly known as:  OSCAL WITH D Take 1 tablet by mouth at bedtime.   cetirizine 10 MG tablet Commonly known as:  ZYRTEC Take 10 mg by mouth daily.   cyclobenzaprine 10 MG tablet Commonly known as:  FLEXERIL  Take 1 tablet (10 mg total) by mouth 3 (three) times daily as needed for muscle spasms.   denosumab 60 MG/ML Sosy injection Commonly known as:  PROLIA Inject 60 mg into the skin every 6 (six) months.   estrogens (conjugated) 0.3 MG tablet Commonly known as:  PREMARIN Take 0.3 mg by mouth daily.   Fish Oil 1000 MG Caps Take 1,000 mg by mouth daily.   folic acid 1 MG tablet Commonly known as:  FOLVITE Take 3 mg by mouth daily.   magnesium oxide 400 MG tablet Commonly known as:  MAG-OX Take 400 mg by mouth at bedtime.   meloxicam 15 MG tablet Commonly known as:  MOBIC Take 15 mg by mouth daily.   ondansetron 4 MG tablet Commonly known as:  Zofran Take 1 tablet (4 mg total) by mouth every 8 (eight) hours as needed for nausea or vomiting.   OVER THE COUNTER MEDICATION Take 500 mg by mouth daily. Black Cumin Seed Oil 500 mg Capsule   oxyCODONE 5 MG immediate release tablet Commonly known as:  Oxy IR/ROXICODONE Take 1-2 tablets (5-10 mg total) by mouth every 4 (four) hours as needed for severe pain.   traMADol 50 MG tablet Commonly known as:  Ultram Take 1 tablet (50 mg total) by mouth every 6 (six) hours as needed for moderate pain.   Turmeric 500 MG Caps Take 500 mg by mouth daily.       FOLLOW UP VISIT:   Follow-up Information    Francena Hanly, MD.   Specialty:  Orthopedic Surgery Why:  call to be seen in 10-14 days Contact information: [address] [place] [address] [phone]            DISCHARGE TO: Home   DISCHARGE CONDITION:  Sonya Fowler for Dr. Francena Hanly 03/01/2019, 8:51 AM

## 2019-03-20 NOTE — Progress Notes (Signed)
OT NOTE (RECORDED 03/20/19 at 13:24pm)  Pt called acute rehab 03/20/19 with request for OT to call back at [phone] regarding questions for sling.   Ot spoke directly to MD office and confirmed the patient was seen on Friday May 1 with continued sling requirements. OT called patient and spoke directly with regarding sling questions. Pt expressed that she does not feel her sling is properly don and it had "come apart." OT emailed visual directions for sling to [email or web address] from the manufacture web site (PDF form). The PDF gives step by step directions for positioning and don of sling.    Mateo Flow, OTR/L  Acute Rehabilitation Services Pager: [phone] Office: [phone] .

## 2019-11-29 ENCOUNTER — Other Ambulatory Visit: Payer: Self-pay | Admitting: Family Medicine

## 2019-11-29 DIAGNOSIS — Z1231 Encounter for screening mammogram for malignant neoplasm of breast: Secondary | ICD-10-CM

## 2019-12-05 ENCOUNTER — Ambulatory Visit: Payer: Medicare Other | Attending: Internal Medicine

## 2019-12-05 DIAGNOSIS — Z23 Encounter for immunization: Secondary | ICD-10-CM | POA: Insufficient documentation

## 2019-12-05 NOTE — Progress Notes (Signed)
   Covid-19 Vaccination Clinic  Name:  Sonya Fowler    MRN: [id number] DOB: 1951/08/15  12/05/2019  [name] was observed post Covid-19 immunization for 15 minutes without incidence. She was provided with Vaccine Information Sheet and instruction to access the V-Safe system.   [name] was instructed to call 911 with any severe reactions post vaccine: Marland Kitchen Difficulty breathing  . Swelling of your face and throat  . A fast heartbeat  . A bad rash all over your body  . Dizziness and weakness    Immunizations Administered    Name Date Dose VIS Date Route   Pfizer COVID-19 Vaccine 12/05/2019  5:06 PM 0.3 mL 10/25/2019 Intramuscular   Manufacturer: [employer], [employer]   Lot: [id number]   NDC: [address]-8

## 2019-12-26 ENCOUNTER — Ambulatory Visit: Payer: Medicare Other | Attending: Internal Medicine

## 2019-12-26 DIAGNOSIS — Z23 Encounter for immunization: Secondary | ICD-10-CM | POA: Insufficient documentation

## 2019-12-26 NOTE — Progress Notes (Signed)
   Covid-19 Vaccination Clinic  Name:  Sonya Fowler    MRN: [id number] DOB: May 02, 1951  12/26/2019  [name] was observed post Covid-19 immunization for 15 minutes without incidence. She was provided with Vaccine Information Sheet and instruction to access the V-Safe system.   [name] was instructed to call 911 with any severe reactions post vaccine: Marland Kitchen Difficulty breathing  . Swelling of your face and throat  . A fast heartbeat  . A bad rash all over your body  . Dizziness and weakness    Immunizations Administered    Name Date Dose VIS Date Route   Pfizer COVID-19 Vaccine 12/26/2019  9:45 AM 0.3 mL 10/25/2019 Intramuscular   Manufacturer: [employer], [employer]   Lot: [id number]   NDC: [address]-4

## 2020-01-08 ENCOUNTER — Other Ambulatory Visit: Payer: Self-pay

## 2020-01-08 ENCOUNTER — Ambulatory Visit
Admission: RE | Admit: 2020-01-08 | Discharge: 2020-01-08 | Disposition: A | Discharge status: Home / Self Care | Payer: Medicare Other | Source: Ambulatory Visit | Attending: Family Medicine | Admitting: Family Medicine

## 2020-01-08 DIAGNOSIS — Z1231 Encounter for screening mammogram for malignant neoplasm of breast: Secondary | ICD-10-CM

## 2020-01-11 ENCOUNTER — Ambulatory Visit: Payer: Medicare Other

## 2020-03-23 ENCOUNTER — Ambulatory Visit: Payer: Medicare Other | Admitting: Physician Assistant

## 2020-04-15 ENCOUNTER — Ambulatory Visit: Payer: Medicare Other | Admitting: Physician Assistant

## 2021-09-27 IMAGING — MG DIGITAL SCREENING BILAT W/ TOMO W/ CAD
6 of 10 series · 6 of 30 positions shown · non-contrast
Comparison: Previous exam(s).

CLINICAL DATA: Screening.

EXAM:
DIGITAL SCREENING BILATERAL MAMMOGRAM WITH TOMO AND CAD

[L CC synth-2D (1 of 2)]
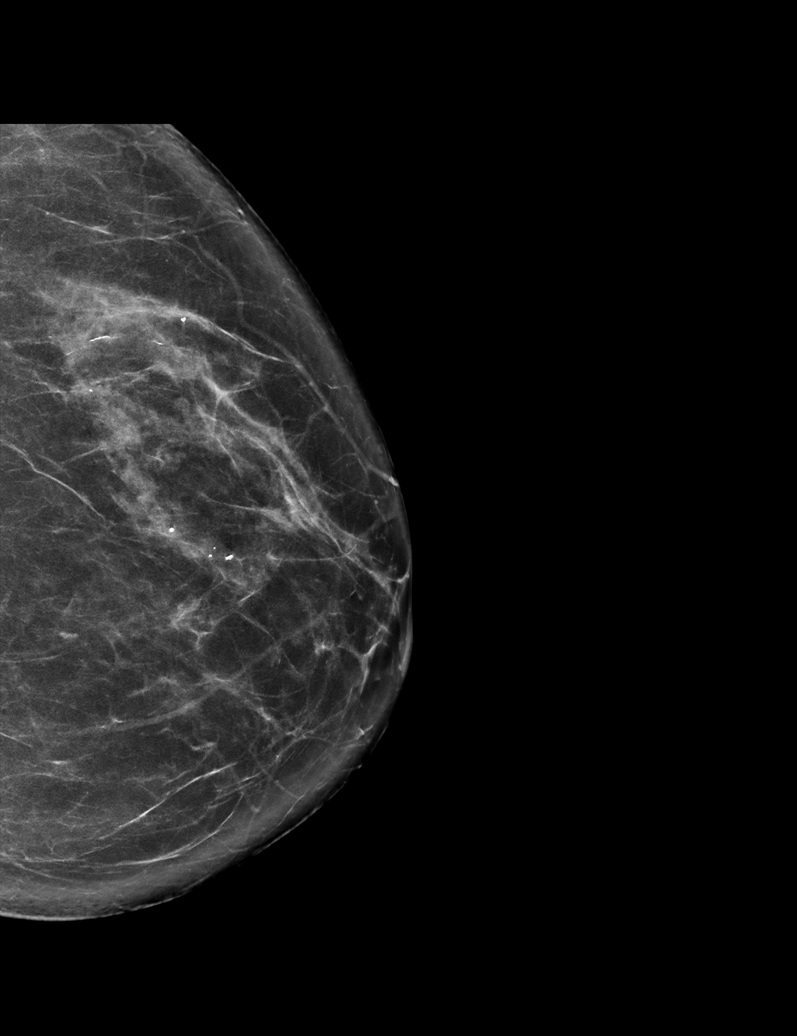

[L CC synth-2D (2 of 2)]
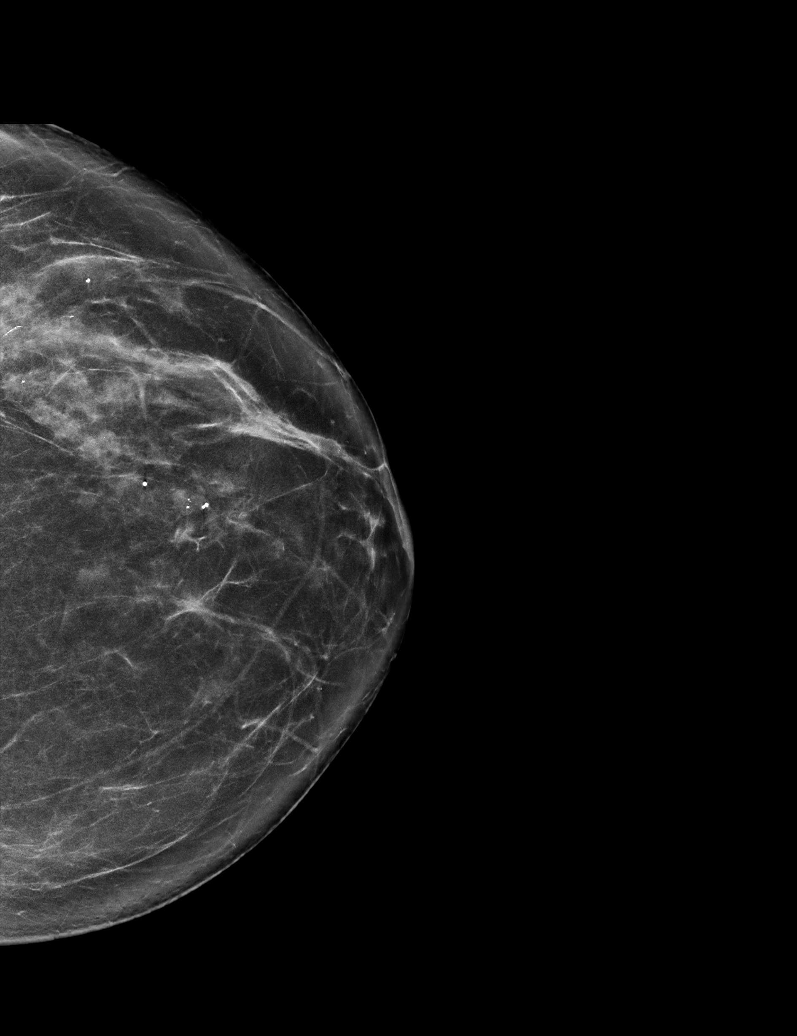

[R MLO synth-2D]
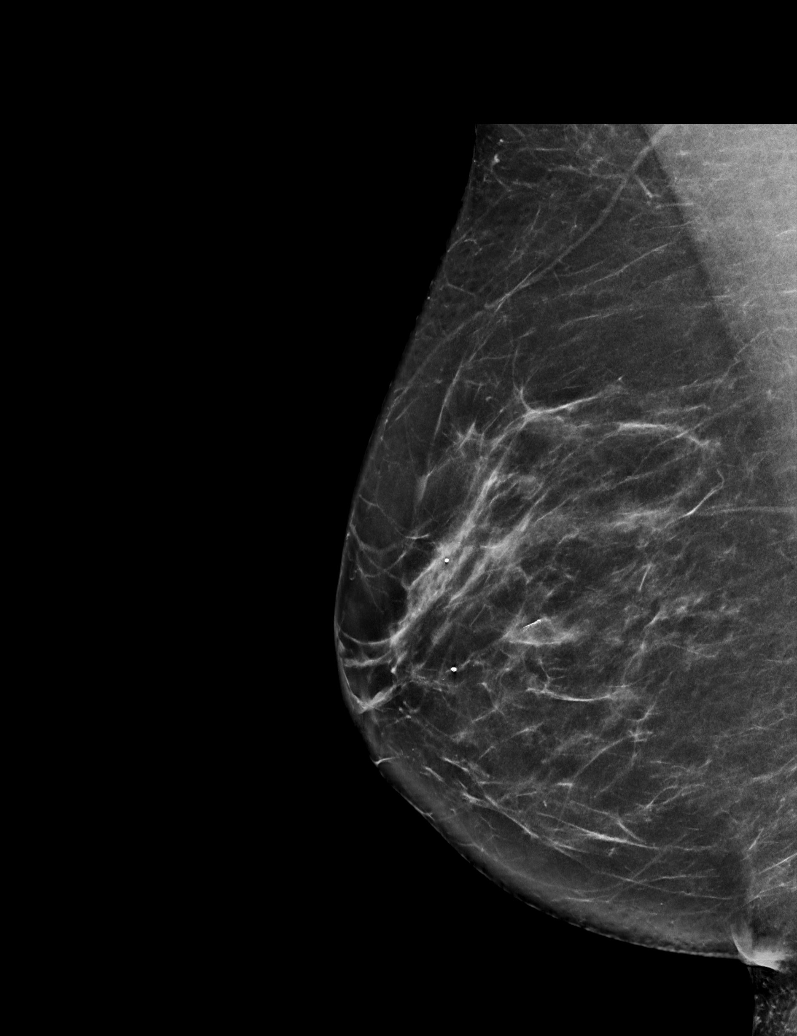

[L MLO synth-2D]
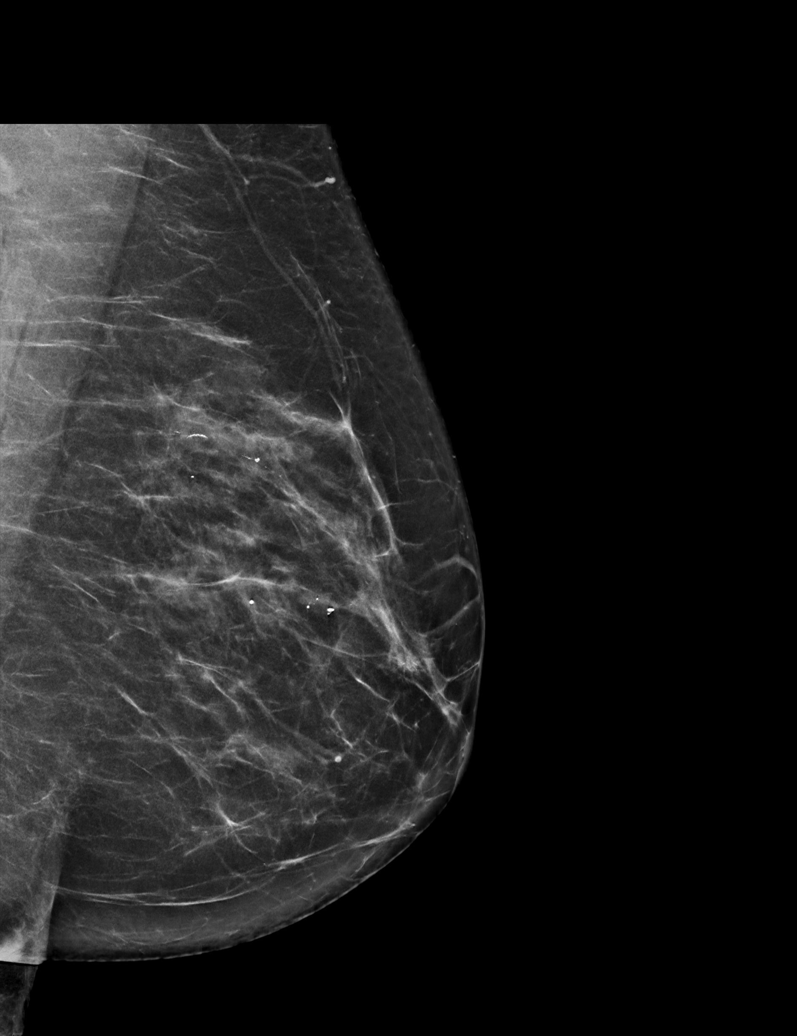

[R CC synth-2D]
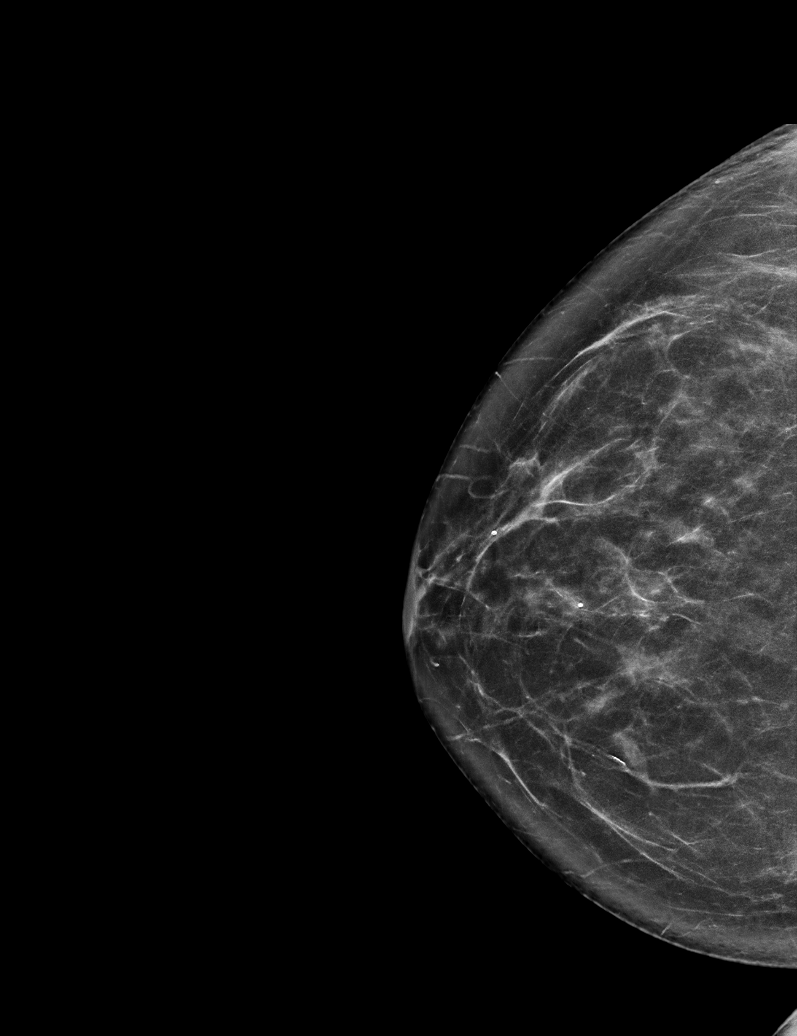

[R CC tomo · tomo slice 35/68.0]
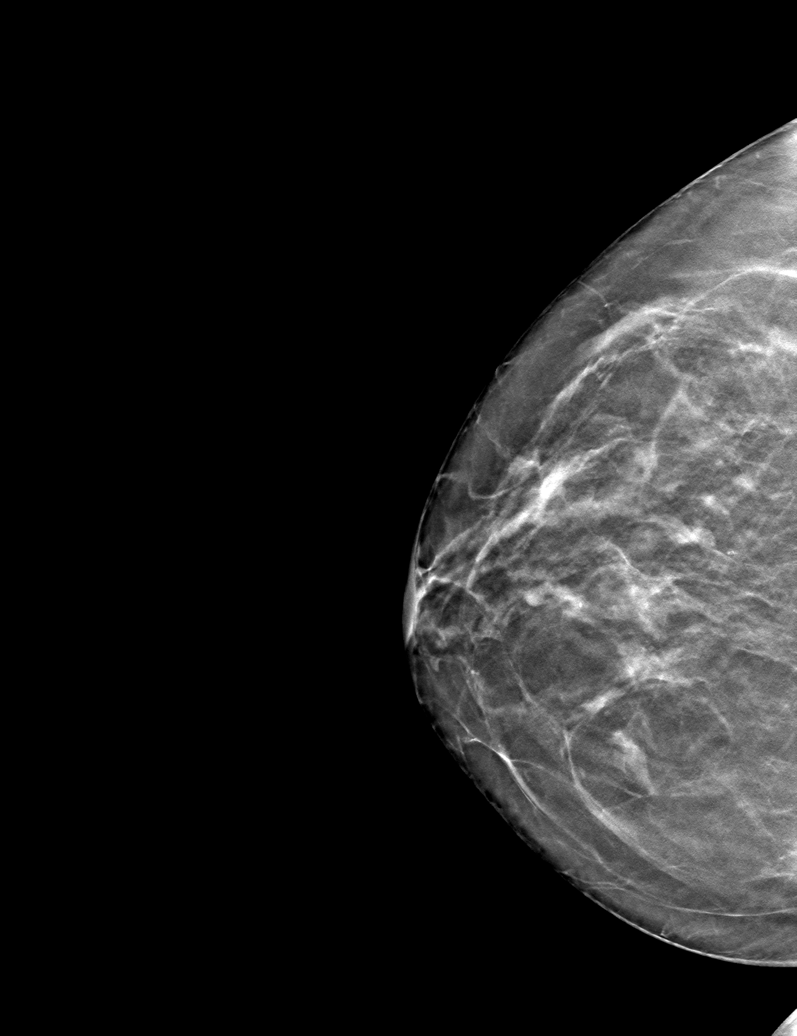

[6 of 30 positions shown; findings below may reference images not displayed]

ACR Breast Density Category b: There are scattered areas of
fibroglandular density.
FINDINGS: There are no findings suspicious for malignancy. Images were
processed with CAD.
IMPRESSION: No mammographic evidence of malignancy. A result letter of this
screening mammogram will be mailed directly to the patient.

RECOMMENDATION:
Screening mammogram in one year. (Code:[id number])

BI-RADS CATEGORY  1: Negative.

## 2022-01-26 ENCOUNTER — Ambulatory Visit (INDEPENDENT_AMBULATORY_CARE_PROVIDER_SITE_OTHER): Payer: Medicare Other | Admitting: Psychology

## 2022-01-26 DIAGNOSIS — F4310 Post-traumatic stress disorder, unspecified: Secondary | ICD-10-CM

## 2022-01-26 NOTE — Progress Notes (Signed)
[facility] Counselor Initial Adult Exam  Name: Sonya Fowler Date: 01/26/2022 MRN: [id number] DOB: 1951-10-16 PCP: [place] Campbell., PA-C  Time Spent: 11:30  am - 12:30 pm: 60 Minutes   Sonya Fowler participated from home, via video, and consented to treatment. Therapist participated from home office. We met online due to COVID pandemic.   Guardian/Payee:  N/A    Paperwork requested: No   Reason for Visit /Presenting Problem: low self esteem, marital conflict  Mental Status Exam: Appearance:   Casual     Behavior:  Appropriate  Motor:  Normal  Speech/Language:   Normal Rate  Affect:  Appropriate  Mood:  depressed  Thought process:  normal  Thought content:    WNL  Sensory/Perceptual disturbances:    WNL  Orientation:  oriented to person, place, and time/date  Attention:  Good  Concentration:  Good  Memory:  WNL  Fund of knowledge:   Good  Insight:    Good  Judgment:   Good  Impulse Control:  Good    Reported Symptoms:  anxious,depressed, low self esteem  Risk Assessment: Danger to Self:  No Self-injurious Behavior: No Danger to Others: No Duty to Warn:no Physical Aggression / Violence:No  Access to Firearms a concern: No  Gang Involvement:No  Patient / guardian was educated about steps to take if suicide or homicide risk level increases between visits: n/a While future psychiatric events cannot be accurately predicted, the patient does not currently require acute inpatient psychiatric care and does not currently meet [facility] involuntary commitment criteria.  Substance Abuse History: Current substance abuse: No     Past Psychiatric History:   No previous psychological problems have been observed Outpatient Providers:psychologist History of Psych Hospitalization: No  Psychological Testing:  N/A    Abuse History:  Victim of: No.,  N/A    Report needed: No. Victim of Neglect:No. Perpetrator of emotional and N/A   Witness  / Exposure to Domestic Violence: No   Protective Services Involvement: No  Witness to [employer] Violence:  No   Family History:  Family History  Problem Relation Age of Onset   Sonya Fowler    Sonya Fowler    Sonya Fowler    Sonya Fowler    Sonya Fowler    Sonya Fowler    Sonya disease Fowler    Sonya Fowler    Sonya Fowler     Living situation: the patient lives with their spouse  Sexual Orientation: Straight  Relationship Status: married  Name of spouse / other:[name] If a parent, number of children / ages:40 and 42  Support Systems: N/A  Surveyor, quantity Stress:  No   Income/Employment/Disability: retired  Financial planner: No   Educational History: Education: Risk manager: unknown  Any cultural differences that may affect / interfere with treatment:  not applicable   Recreation/Hobbies: golf, reading,cooking, outdoors  Stressors: Marital or family conflict    Strengths: Friends  Barriers:  Lack of partner Electronics engineer History: Pending legal issue / charges: The patient has no significant history of legal issues. History of legal issue / charges:  N/A  Medical History/Surgical History: not reviewed Past Medical History:  Diagnosis Date   Chest pain    Depression    situational (PMH)   Family history of adverse reaction to anesthesia    Son had " blue extremities and was sluggish waking up after throat surgery ."   Leg  pain, right    Osteopenia    Proximal humerus fracture    displaced left   Rheumatoid arthritis (HCC)    UTI (urinary tract infection)    Wears glasses     Past Surgical History:  Procedure Laterality Date   ABDOMINAL HYSTERECTOMY     COLONOSCOPY     FRACTURE SURGERY     KNEE ARTHROSCOPY Right    ORIF HUMERUS FRACTURE Left 02/28/2019   ORIF HUMERUS FRACTURE Left 02/28/2019   Procedure: OPEN REDUCTION INTERNAL  FIXATION (ORIF) PROXIMAL HUMERUS FRACTURE;  Surgeon: Francena Hanly, MD;  Location: [facility] OR;  Service: Orthopedics;  Laterality: Left;  to follow 1st case   PARTIAL HYSTERECTOMY     TONSILLECTOMY     WISDOM TOOTH EXTRACTION     WISDOM TOOTH EXTRACTION      Medications: Current Outpatient Medications  Medication Sig Dispense Refill   calcium-vitamin D (OSCAL WITH D) 500-200 MG-UNIT tablet Take 1 tablet by mouth at bedtime.     cetirizine (ZYRTEC) 10 MG tablet Take 10 mg by mouth daily.     cyclobenzaprine (FLEXERIL) 10 MG tablet Take 1 tablet (10 mg total) by mouth 3 (three) times daily as needed for muscle spasms. 30 tablet 1   denosumab (PROLIA) 60 MG/ML SOSY injection Inject 60 mg into the skin every 6 (six) months.     estrogens, conjugated, (PREMARIN) 0.3 MG tablet Take 0.3 mg by mouth daily.      folic acid (FOLVITE) 1 MG tablet Take 3 mg by mouth daily.      magnesium oxide (MAG-OX) 400 MG tablet Take 400 mg by mouth at bedtime.     meloxicam (MOBIC) 15 MG tablet Take 15 mg by mouth daily.     Omega-3 Fatty Acids (FISH OIL) 1000 MG CAPS Take 1,000 mg by mouth daily.     ondansetron (ZOFRAN) 4 MG tablet Take 1 tablet (4 mg total) by mouth every 8 (eight) hours as needed for nausea or vomiting. 10 tablet 0   OVER THE COUNTER MEDICATION Take 500 mg by mouth daily. Black Cumin Seed Oil 500 mg Capsule     oxyCODONE (OXY IR/ROXICODONE) 5 MG immediate release tablet Take 1-2 tablets (5-10 mg total) by mouth every 4 (four) hours as needed for severe pain. 20 tablet 0   traMADol (ULTRAM) 50 MG tablet Take 1 tablet (50 mg total) by mouth every 6 (six) hours as needed for moderate pain. 30 tablet 0   Turmeric 500 MG CAPS Take 500 mg by mouth daily.     No current facility-administered medications for this visit.    Allergies  Allergen Reactions   Penicillin G Rash and Other (See Comments)    Did it involve swelling of the face/tongue/throat, SOB, or low BP? No Did it involve sudden or  severe rash/hives, skin peeling, or any reaction on the inside of your mouth or nose? #  #  #  YES  #  #  #  Did you need to seek medical attention at a hospital or doctor's office? No When did it last happen? Childhood rxn If all above answers are "NO", may proceed with cephalosporin use.  Pt tolerated 2g ancef on 02/28/19, no rash, no reaction noted   Ciprofloxacin Hcl     Avoid all fluoroquinolones cause tendon rupture; pt has RA.   Morphine Itching   Sulfamethoxazole Nausea And Vomiting  Initial session note: Patient states that she is having a "longstanding marital challenge". Married for 46  years to high school sweetheart. Moved here to [place] in early 90's. Her husband was fired 20 years ago and had to stay out for 1 year due to non compete. Got opportunity in [place] area and they both figured it could work. They got an apartment there and commuted. He also started a long term affair with a woman she know and who worked for him. He gas-lighted her for many years. He does not communicate well with her and would stonewall their communication. He would say he felt bad but never said he planned to stop. 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 She never threatened him, but he knew it. He was high level and would travel the world with him. The woman he was having a relationship with would also be on these trips. She says he would just tell her "you will be fine". She says she is a very "feeling" person and her husband Ree Kida never had the capacity to be a feeling person. He had to retire at 41 and moved back to [place] just as Covid 19 hit. They had only see each other on weekends for 15 years before his retirement. They still own the condo in [place]. She says he could not let the condo go and she thinks it is related to his loss of role. The affair was 13 years and she broke it off and he was devastated. The woman who he was having an affair  with Dorene Grebe) was not married. She caught him texting Dorene Grebe after he had moved home and told her they were broken up. She has shared this situation with several friends. Ree Kida told their son in [place] about the affair. She never told the oldest son Arlys John because it would hurt him. She lives in chronic state of low self esteem. She says she loves him and he complains that she will "never get over the affair". Ree Kida tell her that they are different and "not compatible". He tells her "this is who I am". She tells him what she needs and he does not give it to her. She asks him for some indication of affection (hug, kiss, anything physical). She wants a sexual relationship, but they do not have regular relations. Says that "maybe once every 4 months" they will have relations. Everyone says he is a "great guy", but he doesn't have any tight friends.  She is seeking a female perspective because her previous therapists have all been women. His previous therapist told Magdalena that "he is a great guy" and that she would need to accept his infidelity in order to be with him. She wants to know if she can accept being married to a man with NO empathy. She says "I just need him to reach out a little". Edita says she speaks her mind and it is a turn off to him. She says her therapist was great and diagnosed her with PTSD. Her therapist was empathetic and told her "you have done all you can do and he will not change". She encourage Harshini to either come to peace with marriage or leave. They did try couple's counseling at one time when she threatened to leave. The therapist was young and Risako felt she was out of her league. She is hoping this therapy can help her have better confidence and that, at some point, maybe include Ree Kida into the process to improve the relationship. The relationship now is "cordial" and  they live a life without intimacy. She says to him she misses the intimacy they once had. She does not think  he had other affairs in the first 30 years of marriage. Ree Kida tell her that he wants to stay married, but there are times he gets really upset with her and says "this isn't going to work". She feels if he had a "better option" then he would leave.  They have 2 boys (40 and 43). The older son is in [place], married with a 18 year old. Her younger son Thayer Ohm has attitude that if you are unhappy, then you should leave. She feels that Thayer Ohm may be depressed or bipolar. FOO: History in her family of depression. Her Fowler and 2 brothers had depression. Both brothers were on drugs to self medicate. She feels that she depressed when she found out about the affair and went on anti depressant for a brief period. She says she does not feel depressed. She finds great pleasure in many things in nature. She loves being outside, being with friends, reading golfing and cooking.  She does admit to having "moments of hopelessness", but is clear to say she is not depressed.  Medical: Diagnosed with RA 17 years ago. Takes meds and tolerates the pain.  She and Ree Kida socially drink. Lots of alcoholics in [place] family. He tends to drink heavily (a couple of drinks and a half bottle of wine a night). They are starting to do more with other couples all of which she has initiated. Her parents and [name]'s parents are deceased.         Diagnoses:  PTSD  Plan of Care: Outpatient psychotherapy   Garrel Ridgel, PhD 11:30a-12:30p Time: 60 minutes               Garrel Ridgel, PhD

## 2022-02-08 ENCOUNTER — Ambulatory Visit (INDEPENDENT_AMBULATORY_CARE_PROVIDER_SITE_OTHER): Payer: Medicare Other | Admitting: Psychology

## 2022-02-08 DIAGNOSIS — F4310 Post-traumatic stress disorder, unspecified: Secondary | ICD-10-CM | POA: Diagnosis not present

## 2022-02-08 NOTE — Progress Notes (Signed)
? ? ? ? ? ? ? ? ? ? ? ? ? ? ? ?  [facility] Counselor Initial Adult Exam ? ?Name: Sonya Fowler ?Date: 02/08/2022 ?MRN: [id number] ?DOB: 1951-02-11 ?PCP: Aletha Halim., PA-C ? ?Time Spent: 8:40  am - 9:30 am 50 Minutes ? ? ?[name] participated from home, via video, and consented to treatment. Therapist participated from home office. We met online due to [place] pandemic. ? ? ?Guardian/Payee:  N/A   ? ?Paperwork requested: No  ? ?Reason for Visit /Presenting Problem: low self esteem, marital conflict ? ?Mental Status Exam: ?Appearance:   Casual     ?Behavior:  Appropriate  ?Motor:  Normal  ?Speech/Language:   Normal Rate  ?Affect:  Appropriate  ?Mood:  depressed  ?Thought process:  normal  ?Thought content:    WNL  ?Sensory/Perceptual disturbances:    WNL  ?Orientation:  oriented to person, place, and time/date  ?Attention:  Good  ?Concentration:  Good  ?Memory:  WNL  ?Fund of knowledge:   Good  ?Insight:    Good  ?Judgment:   Good  ?Impulse Control:  Good  ? ? ?Reported Symptoms:  anxious,depressed, low self esteem ? ?Risk Assessment: ?Danger to Self:  No ?Self-injurious Behavior: No ?Danger to Others: No ?Duty to Warn:no ?Physical Aggression / Violence:No  ?Access to Firearms a concern: No  ?Gang Involvement:No  ?Patient / guardian was educated about steps to take if suicide or homicide risk level increases between visits: n/a ?While future psychiatric events cannot be accurately predicted, the patient does not currently require acute inpatient psychiatric care and does not currently meet [facility] involuntary commitment criteria. ? ?Substance Abuse History: ?Current substance abuse: No    ? ?Past Psychiatric History:   ?No previous psychological problems have been observed ?Outpatient Providers:psychologist ?History of Psych Hospitalization: No  ?Psychological Testing:  N/A   ? ?Abuse History:  ?Victim of: No.,  N/A    ?Report needed: No. ?Victim of Neglect:No. ?Perpetrator of  emotional and N/A   ?Witness / Exposure to Domestic Violence: No   ?Protective Services Involvement: No  ?Witness to [employer] Violence:  No  ? ?Family History:  ?Family History  ?Problem Relation Age of Onset  ? Valvular heart disease Mother   ? Atrial fibrillation Mother   ? Hypertension Father   ? Heart disease Father   ? Heart disease Sister   ? Hypertension Sister   ? Heart disease Brother   ? Hypertension Brother   ? Hypertension Brother   ? ? ?Living situation: the patient lives with their spouse ? ?Sexual Orientation: Straight ? ?Relationship Status: married  ?Name of spouse / other:[name] ?If a parent, number of children / ages:40 and 74 ? ?Support Systems: N/A ? ?Financial Stress:  No  ? ?Income/Employment/Disability: retired ? ?Military Service: No  ? ?Educational History: ?Education: college graduate ? ?Religion/Sprituality/World View: ?unknown ? ?Any cultural differences that may affect / interfere with treatment:  not applicable  ? ?Recreation/Hobbies: golf, reading,cooking, outdoors ? ?Stressors: Marital or family conflict   ? ?Strengths: Friends ? ?Barriers:  Lack of partner cooperation  ? ?Legal History: ?Pending legal issue / charges: The patient has no significant history of legal issues. ?History of legal issue / charges:  N/A ? ?Medical History/Surgical History: not reviewed ?Past Medical History:  ?Diagnosis Date  ? Chest pain   ? Depression   ? situational (PMH)  ? Family history of adverse reaction to anesthesia   ? Son had "  blue extremities and was sluggish waking up after throat surgery ."  ? Leg pain, right   ? Osteopenia   ? Proximal humerus fracture   ? displaced left  ? Rheumatoid arthritis ([place])   ? UTI (urinary tract infection)   ? Wears glasses   ? ? ?Past Surgical History:  ?Procedure Laterality Date  ? ABDOMINAL HYSTERECTOMY    ? COLONOSCOPY    ? FRACTURE SURGERY    ? KNEE ARTHROSCOPY Right   ? ORIF HUMERUS FRACTURE Left 02/28/2019  ? ORIF HUMERUS FRACTURE Left 02/28/2019  ?  Procedure: OPEN REDUCTION INTERNAL FIXATION (ORIF) PROXIMAL HUMERUS FRACTURE;  Surgeon: Justice Britain, MD;  Location: [facility];  Service: Orthopedics;  Laterality: Left;  175mn ?to follow 1st case  ? PARTIAL HYSTERECTOMY    ? TONSILLECTOMY    ? WISDOM TOOTH EXTRACTION    ? WISDOM TOOTH EXTRACTION    ? ? ?Medications: ?Current Outpatient Medications  ?Medication Sig Dispense Refill  ? calcium-vitamin D (OSCAL WITH D) 500-200 MG-UNIT tablet Take 1 tablet by mouth at bedtime.    ? cetirizine (ZYRTEC) 10 MG tablet Take 10 mg by mouth daily.    ? cyclobenzaprine (FLEXERIL) 10 MG tablet Take 1 tablet (10 mg total) by mouth 3 (three) times daily as needed for muscle spasms. 30 tablet 1  ? denosumab (PROLIA) 60 MG/ML SOSY injection Inject 60 mg into the skin every 6 (six) months.    ? estrogens, conjugated, (PREMARIN) 0.3 MG tablet Take 0.3 mg by mouth daily.     ? folic acid (FOLVITE) 1 MG tablet Take 3 mg by mouth daily.     ? magnesium oxide (MAG-OX) 400 MG tablet Take 400 mg by mouth at bedtime.    ? meloxicam (MOBIC) 15 MG tablet Take 15 mg by mouth daily.    ? Omega-3 Fatty Acids (FISH OIL) 1000 MG CAPS Take 1,000 mg by mouth daily.    ? ondansetron (ZOFRAN) 4 MG tablet Take 1 tablet (4 mg total) by mouth every 8 (eight) hours as needed for nausea or vomiting. 10 tablet 0  ? OVER THE COUNTER MEDICATION Take 500 mg by mouth daily. Black Cumin Seed Oil 500 mg Capsule    ? oxyCODONE (OXY IR/ROXICODONE) 5 MG immediate release tablet Take 1-2 tablets (5-10 mg total) by mouth every 4 (four) hours as needed for severe pain. 20 tablet 0  ? traMADol (ULTRAM) 50 MG tablet Take 1 tablet (50 mg total) by mouth every 6 (six) hours as needed for moderate pain. 30 tablet 0  ? Turmeric 500 MG CAPS Take 500 mg by mouth daily.    ? ?No current facility-administered medications for this visit.  ? ? ?Allergies  ?Allergen Reactions  ? Penicillin G Rash and Other (See Comments)  ?  Did it involve swelling of the face/tongue/throat, SOB, or  low BP? No ?Did it involve sudden or severe rash/hives, skin peeling, or any reaction on the inside of your mouth or nose? #  #  #  YES  #  #  #  ?Did you need to seek medical attention at a hospital or doctor's office? No ?When did it last happen? Childhood rxn ?If all above answers are "NO", may proceed with cephalosporin use. ? ?Pt tolerated 2g ancef on 02/28/19, no rash, no reaction noted  ? Ciprofloxacin Hcl   ?  Avoid all fluoroquinolones cause tendon rupture; pt has RA.  ? Morphine Itching  ? Sulfamethoxazole Nausea And Vomiting  ?session note: ?  Rosezella [place] says she was thinking about her lack of confidence. Wondering why? Says that it is "his infidelity" and all of her husband's behavior before the affair. That is she never felt certain he loved her because he never expressed those loving feelings for her. There was always an absence of affection and expressions of love. "No emotional connection". He "never says my name". He still never uses her name and has no insight or empathy for her feelings. He feels she expects too much from him. She says "I did want him to tuck in the boys and I did want to hear my name". The way he proposed to her was "this commute is getting old so why don't we just get married". When in labor with their first son, she went into labor in bed and he said "why don't you just go to couch so I can get some rest and call me when you are ready to go".  He tells her that he will not change and she says "I'm just asking you to change you behavior just a little to make me feel better". She says she has tried to meet him half way, but he will not budge. She does say that since he has retired, he is far more active with chores around the house. She says she no longer can be loving and supportive emotionally of her husband. She says "at this point, I think he needs to be the one to initiate loving gestures". He prefers to just have good dialogue about world events and any non-emotional topics. If  she would "just not expect anything emotionally from him". When she says that she is upset, he says "I said I am sorry. You need to let go of the past". Over the years, she would write long e-mails and letters a

## 2022-02-19 ENCOUNTER — Encounter (HOSPITAL_BASED_OUTPATIENT_CLINIC_OR_DEPARTMENT_OTHER): Payer: Self-pay | Admitting: Emergency Medicine

## 2022-02-19 ENCOUNTER — Emergency Department (HOSPITAL_BASED_OUTPATIENT_CLINIC_OR_DEPARTMENT_OTHER)
Admission: EM | Admit: 2022-02-19 | Discharge: 2022-02-19 | Disposition: A | Discharge status: Home / Self Care | Payer: Medicare Other | Attending: Emergency Medicine | Admitting: Emergency Medicine

## 2022-02-19 ENCOUNTER — Other Ambulatory Visit: Payer: Self-pay

## 2022-02-19 DIAGNOSIS — U071 COVID-19: Secondary | ICD-10-CM | POA: Insufficient documentation

## 2022-02-19 DIAGNOSIS — M791 Myalgia, unspecified site: Secondary | ICD-10-CM | POA: Diagnosis present

## 2022-02-19 LAB — RESP PANEL BY RT-PCR (FLU A&B, COVID) ARPGX2
Influenza A by PCR: NEGATIVE
Influenza B by PCR: NEGATIVE
SARS Coronavirus 2 by RT PCR: POSITIVE — AB

## 2022-02-19 MED ORDER — MOLNUPIRAVIR EUA 200MG CAPSULE: 4.0000 | ORAL_CAPSULE | Freq: Two times a day (BID) | ORAL | 0 refills | Status: AC

## 2022-02-19 NOTE — Discharge Instructions (Addendum)
It was a pleasure taking care of you!  ? ?Your flu swab was negative. Your COVID swab was positive for COVID.  According to the CDC, you must self quarantine for 5 days from the start of your symptoms.  Your quarantine period ends on 02/22/2022.  You will be sent a prescription for molnupiravir, take as directed for your symptoms. You may take over-the-counter cough and cold medications as needed for your symptoms. Ensure to maintain fluid intake with tea, soup, broth, Pedialyte, Gatorade, water. You may follow-up with your primary care provider as needed.  Return to the Emergency Department if you are experiencing trouble breathing, worsening or increasing chest pain, decreased fluid intake or worsening symptoms. ?

## 2022-02-19 NOTE — ED Provider Notes (Signed)
?[facility] EMERGENCY DEPT ?Provider Note ? ? ?CSN: [id number] ?Arrival date & time: 02/19/22  1605 ? ?  ? ?History ? ?Chief Complaint  ?Patient presents with  ? Generalized Body Aches  ? ? ?Sonya Fowler is a 71 y.o. female who presents to the emergency department complaining of generalized body aches onset last night.  Denies sick contacts.  She has associated fever, headache, chest tightness, cough.  Has tried Tylenol with moderately for symptoms.  Denies chills, photophobia, vision changes, nasal congestion, rhinorrhea, sneezing, chest pain, shortness of breath, nausea, vomiting. ? ? ?The history is provided by the patient. No language interpreter was used.  ? ?  ? ?Home Medications ?Prior to Admission medications   ?Medication Sig Start Date End Date Taking? Authorizing Provider  ?molnupiravir EUA (LAGEVRIO) 200 mg CAPS capsule Take 4 capsules (800 mg total) by mouth 2 (two) times daily for 5 days. 02/19/22 02/24/22 Yes Giabella Duhart A, PA-C  ?calcium-vitamin D (OSCAL WITH D) 500-200 MG-UNIT tablet Take 1 tablet by mouth at bedtime.    [provider]  ?cetirizine (ZYRTEC) 10 MG tablet Take 10 mg by mouth daily.    [provider]  ?cyclobenzaprine (FLEXERIL) 10 MG tablet Take 1 tablet (10 mg total) by mouth 3 (three) times daily as needed for muscle spasms. 03/01/19   Shuford, French Ana, PA-C  ?denosumab (PROLIA) 60 MG/ML SOSY injection Inject 60 mg into the skin every 6 (six) months.    [provider]  ?estrogens, conjugated, (PREMARIN) 0.3 MG tablet Take 0.3 mg by mouth daily.  10/31/16   [provider]  ?folic acid (FOLVITE) 1 MG tablet Take 3 mg by mouth daily.  10/18/16   [provider]  ?magnesium oxide (MAG-OX) 400 MG tablet Take 400 mg by mouth at bedtime.    [provider]  ?meloxicam (MOBIC) 15 MG tablet Take 15 mg by mouth daily.    [provider]  ?Omega-3 Fatty Acids (FISH OIL) 1000 MG CAPS Take 1,000 mg by mouth daily.     [provider]  ?ondansetron (ZOFRAN) 4 MG tablet Take 1 tablet (4 mg total) by mouth every 8 (eight) hours as needed for nausea or vomiting. 03/01/19   Shuford, French Ana, PA-C  ?OVER THE COUNTER MEDICATION Take 500 mg by mouth daily. Black Cumin Seed Oil 500 mg Capsule    [provider]  ?oxyCODONE (OXY IR/ROXICODONE) 5 MG immediate release tablet Take 1-2 tablets (5-10 mg total) by mouth every 4 (four) hours as needed for severe pain. 03/01/19   Shuford, French Ana, PA-C  ?traMADol (ULTRAM) 50 MG tablet Take 1 tablet (50 mg total) by mouth every 6 (six) hours as needed for moderate pain. 03/01/19   Shuford, French Ana, PA-C  ?Turmeric 500 MG CAPS Take 500 mg by mouth daily.    [provider]  ?   ? ?Allergies    ?Penicillin g, Ciprofloxacin hcl, Morphine, and Sulfamethoxazole   ? ?Review of Systems   ?Review of Systems  ?Constitutional:  Positive for fever. Negative for chills.  ?HENT:  Negative for congestion, rhinorrhea and sneezing.   ?Eyes:  Negative for photophobia.  ?Respiratory:  Positive for cough and chest tightness. Negative for shortness of breath.   ?Cardiovascular:  Negative for chest pain.  ?Gastrointestinal:  Negative for nausea and vomiting.  ?Musculoskeletal:  Positive for myalgias.  ?Skin:  Negative for rash.  ?Neurological:  Positive for headaches.  ?All other systems reviewed and are negative. ? ?Physical Exam ?Updated Vital  Signs ?BP (!) 141/82   Pulse 84   Temp (!) 100.4 ?F (38 ?C) (Oral)   Resp 16   SpO2 99%  ?Physical Exam ?Vitals and nursing note reviewed.  ?Constitutional:   ?   General: She is not in acute distress. ?   Appearance: She is not diaphoretic.  ?HENT:  ?   Head: Normocephalic and atraumatic.  ?   Mouth/Throat:  ?   Pharynx: No oropharyngeal exudate.  ?Eyes:  ?   General: No scleral icterus. ?   Conjunctiva/sclera: Conjunctivae normal.  ?Cardiovascular:  ?   Rate and Rhythm: Normal rate and regular rhythm.  ?   Pulses: Normal pulses.  ?   Heart sounds: Normal  heart sounds.  ?Pulmonary:  ?   Effort: Pulmonary effort is normal. No respiratory distress.  ?   Breath sounds: Normal breath sounds. No wheezing.  ?Abdominal:  ?   General: Bowel sounds are normal.  ?   Palpations: Abdomen is soft. There is no mass.  ?   Tenderness: There is no abdominal tenderness. There is no guarding or rebound.  ?Musculoskeletal:     ?   General: Normal range of motion.  ?   Cervical back: Normal range of motion and neck supple.  ?Skin: ?   General: Skin is warm and dry.  ?Neurological:  ?   Mental Status: She is alert.  ?Psychiatric:     ?   Behavior: Behavior normal.  ? ? ?ED Results / Procedures / Treatments   ?Labs ?(all labs ordered are listed, but only abnormal results are displayed) ?Labs Reviewed  ?RESP PANEL BY RT-PCR (FLU A&B, COVID) ARPGX2 - Abnormal; Notable for the following components:  ?    Result Value  ? SARS Coronavirus 2 by RT PCR POSITIVE (*)   ? All other components within normal limits  ? ? ?EKG ?None ? ?Radiology ?No results found. ? ?Procedures ?Procedures  ? ? ?Medications Ordered in ED ?Medications - No data to display ? ?ED Course/ Medical Decision Making/ A&P ?Clinical Course as of 02/19/22 1956  ?Sat Feb 19, 2022  ?1907 Discussed with patient the importance of discontinuing your Premarin cream while you are taking the molnupiravir.  Answered all available questions.  Patient appears safe for discharge at this time. [SB]  ?  ?Clinical Course User Index ?[SB] Tabb Croghan A, PA-C  ? ?                        ?Medical Decision Making ? ?Pt presents with generalized body aches, fever, headache onset last night.  Denies sick contacts.  Initial vital signs, patient febrile at 100.4, slightly tachycardic at 138, otherwise unremarkable.  On exam patient without acute cardiovascular, respiratory, downtime findings.  Differential diagnosis includes COVID, flu, viral URI with cough. ? ?Labs:  ?I ordered, and personally interpreted labs.  The pertinent results include:    ?COVID swab positive. ?Flu swab negative ? ? ?Disposition: ?Patient presentation suspicious for COVID-19.  Doubt flu, viral URI cough at this time. Prior to discharge, patient temperature rechecked and resolved down to 98.7 and patient not tachycardic.  After consideration of the diagnostic results and the patients response to treatment, I feel that the patient would benefit from Discharge home.  Patient will be discharged home with a prescription for  Molnupiravir.  Discussed with patient importance of discontinuing her Premarin cream for the duration of the antiviral.  Answered all available questions.  Discussed with patient  is that she should follow-up with her primary care provider regarding today's ED visit.  Discussed with patient regarding her quarantine period as per CDC for approximately 5 days and ending on 02/22/2022.  Supportive care measures and strict return precautions discussed with patient at bedside. Pt acknowledges and verbalizes understanding. Pt appears safe for discharge. Follow up as indicated in discharge paperwork.  ? ? ?This chart was dictated using voice recognition software, Dragon. Despite the best efforts of this provider to proofread and correct errors, errors may still occur which can change documentation meaning. ? ? ?Final Clinical Impression(s) / ED Diagnoses ?Final diagnoses:  ?COVID-19  ? ? ?Rx / DC Orders ?ED Discharge Orders   ? ?      Ordered  ?  molnupiravir EUA (LAGEVRIO) 200 mg CAPS capsule  2 times daily       ? 02/19/22 1921  ? ?  ?  ? ?  ? ? ?  ?Elijio Staples A, PA-C ?02/19/22 1956 ? ?  ?Terald Sleeper, MD ?02/20/22 [phone] ? ?

## 2022-02-19 NOTE — ED Triage Notes (Signed)
Generalized body aches, chills, fever 100.4 today, headache. Since last night  ?

## 2022-02-23 ENCOUNTER — Ambulatory Visit: Payer: Medicare Other | Admitting: Psychology

## 2022-03-09 ENCOUNTER — Ambulatory Visit (INDEPENDENT_AMBULATORY_CARE_PROVIDER_SITE_OTHER): Payer: Medicare Other | Admitting: Psychology

## 2022-03-09 DIAGNOSIS — F4310 Post-traumatic stress disorder, unspecified: Secondary | ICD-10-CM

## 2022-03-09 NOTE — Progress Notes (Signed)
? ? ? ? ? ? ? ? ? ? ? ? ? ? ? ? ? ? ? ? ? ? ? ? ? ? ? ? ? ? ?  [facility] Counselor Initial Adult Exam ? ?Name: Sonya Fowler ?Date: 03/09/2022 ?MRN: [id number] ?DOB: 1951-01-03 ?PCP: Aletha Halim., PA-C ? ?Time Spent: 8:40  am - 9:30 am 50 Minutes ? ? ?[name] participated from home, via video, and consented to treatment. Therapist participated from home office. We met online due to [place] pandemic. ? ? ?Guardian/Payee:  N/A   ? ?Paperwork requested: No  ? ?Reason for Visit /Presenting Problem: low self esteem, marital conflict ? ?Mental Status Exam: ?Appearance:   Casual     ?Behavior:  Appropriate  ?Motor:  Normal  ?Speech/Language:   Normal Rate  ?Affect:  Appropriate  ?Mood:  depressed  ?Thought process:  normal  ?Thought content:    WNL  ?Sensory/Perceptual disturbances:    WNL  ?Orientation:  oriented to person, place, and time/date  ?Attention:  Good  ?Concentration:  Good  ?Memory:  WNL  ?Fund of knowledge:   Good  ?Insight:    Good  ?Judgment:   Good  ?Impulse Control:  Good  ? ? ?Reported Symptoms:  anxious,depressed, low self esteem ? ?Risk Assessment: ?Danger to Self:  No ?Self-injurious Behavior: No ?Danger to Others: No ?Duty to Warn:no ?Physical Aggression / Violence:No  ?Access to Firearms a concern: No  ?Gang Involvement:No  ?Patient / guardian was educated about steps to take if suicide or homicide risk level increases between visits: n/a ?While future psychiatric events cannot be accurately predicted, the patient does not currently require acute inpatient psychiatric care and does not currently meet [facility] involuntary commitment criteria. ? ?Substance Abuse History: ?Current substance abuse: No    ? ?Past Psychiatric History:   ?No previous psychological problems have been observed ?Outpatient Providers:psychologist ?History of Psych Hospitalization: No  ?Psychological Testing:  N/A   ? ?Abuse History:  ?Victim of: No.,  N/A    ?Report needed: No. ?Victim of  Neglect:No. ?Perpetrator of emotional and N/A   ?Witness / Exposure to Domestic Violence: No   ?Protective Services Involvement: No  ?Witness to [employer] Violence:  No  ? ?Family History:  ?Family History  ?Problem Relation Age of Onset  ? Valvular heart disease Mother   ? Atrial fibrillation Mother   ? Hypertension Father   ? Heart disease Father   ? Heart disease Sister   ? Hypertension Sister   ? Heart disease Brother   ? Hypertension Brother   ? Hypertension Brother   ? ? ?Living situation: the patient lives with their spouse ? ?Sexual Orientation: Straight ? ?Relationship Status: married  ?Name of spouse / other:[name] ?If a parent, number of children / ages:40 and 3 ? ?Support Systems: N/A ? ?Financial Stress:  No  ? ?Income/Employment/Disability: retired ? ?Military Service: No  ? ?Educational History: ?Education: college graduate ? ?Religion/Sprituality/World View: ?unknown ? ?Any cultural differences that may affect / interfere with treatment:  not applicable  ? ?Recreation/Hobbies: golf, reading,cooking, outdoors ? ?Stressors: Marital or family conflict   ? ?Strengths: Friends ? ?Barriers:  Lack of partner cooperation  ? ?Legal History: ?Pending legal issue / charges: The patient has no significant history of legal issues. ?History of legal issue / charges:  N/A ? ?Medical History/Surgical History: not reviewed ?Past Medical History:  ?Diagnosis Date  ? Chest pain   ? Depression   ? situational (  PMH)  ? Family history of adverse reaction to anesthesia   ? Son had " blue extremities and was sluggish waking up after throat surgery ."  ? Leg pain, right   ? Osteopenia   ? Proximal humerus fracture   ? displaced left  ? Rheumatoid arthritis ([place])   ? UTI (urinary tract infection)   ? Wears glasses   ? ? ?Past Surgical History:  ?Procedure Laterality Date  ? ABDOMINAL HYSTERECTOMY    ? COLONOSCOPY    ? FRACTURE SURGERY    ? KNEE ARTHROSCOPY Right   ? ORIF HUMERUS FRACTURE Left 02/28/2019  ? ORIF HUMERUS  FRACTURE Left 02/28/2019  ? Procedure: OPEN REDUCTION INTERNAL FIXATION (ORIF) PROXIMAL HUMERUS FRACTURE;  Surgeon: Justice Britain, MD;  Location: [place] [facility];  Service: Orthopedics;  Laterality: Left;  156mn ?to follow 1st case  ? PARTIAL HYSTERECTOMY    ? TONSILLECTOMY    ? WISDOM TOOTH EXTRACTION    ? WISDOM TOOTH EXTRACTION    ? ? ?Medications: ?Current Outpatient Medications  ?Medication Sig Dispense Refill  ? calcium-vitamin D (OSCAL WITH D) 500-200 MG-UNIT tablet Take 1 tablet by mouth at bedtime.    ? cetirizine (ZYRTEC) 10 MG tablet Take 10 mg by mouth daily.    ? cyclobenzaprine (FLEXERIL) 10 MG tablet Take 1 tablet (10 mg total) by mouth 3 (three) times daily as needed for muscle spasms. 30 tablet 1  ? denosumab (PROLIA) 60 MG/ML SOSY injection Inject 60 mg into the skin every 6 (six) months.    ? estrogens, conjugated, (PREMARIN) 0.3 MG tablet Take 0.3 mg by mouth daily.     ? folic acid (FOLVITE) 1 MG tablet Take 3 mg by mouth daily.     ? magnesium oxide (MAG-OX) 400 MG tablet Take 400 mg by mouth at bedtime.    ? meloxicam (MOBIC) 15 MG tablet Take 15 mg by mouth daily.    ? Omega-3 Fatty Acids (FISH OIL) 1000 MG CAPS Take 1,000 mg by mouth daily.    ? ondansetron (ZOFRAN) 4 MG tablet Take 1 tablet (4 mg total) by mouth every 8 (eight) hours as needed for nausea or vomiting. 10 tablet 0  ? OVER THE COUNTER MEDICATION Take 500 mg by mouth daily. Black Cumin Seed Oil 500 mg Capsule    ? oxyCODONE (OXY IR/ROXICODONE) 5 MG immediate release tablet Take 1-2 tablets (5-10 mg total) by mouth every 4 (four) hours as needed for severe pain. 20 tablet 0  ? traMADol (ULTRAM) 50 MG tablet Take 1 tablet (50 mg total) by mouth every 6 (six) hours as needed for moderate pain. 30 tablet 0  ? Turmeric 500 MG CAPS Take 500 mg by mouth daily.    ? ?No current facility-administered medications for this visit.  ? ? ?Allergies  ?Allergen Reactions  ? Penicillin G Rash and Other (See Comments)  ?  Did it involve swelling of the  face/tongue/throat, SOB, or low BP? No ?Did it involve sudden or severe rash/hives, skin peeling, or any reaction on the inside of your mouth or nose? #  #  #  YES  #  #  #  ?Did you need to seek medical attention at a hospital or doctor's office? No ?When did it last happen? Childhood rxn ?If all above answers are "NO", may proceed with cephalosporin use. ? ?Pt tolerated 2g ancef on 02/28/19, no rash, no reaction noted  ? Ciprofloxacin Hcl   ?  Avoid all fluoroquinolones cause tendon rupture;  pt has RA.  ? Morphine Itching  ? Sulfamethoxazole Nausea And Vomiting  ?Goals: Seeks counseling to better understand her complicated relationship with her husband and her enabling behavior. Has symptoms of depression/anxiety as a result. Will utilize [place] exploration and psychodynamic insight oriented therapy to resolve issues. Goal date is 12-23. ?Patient agrees to have session in person in provider office. ? ?session note: ?She got Covid 19 and says it was positive in that Barnabas Lister had to take care of her. In the past, he avoided having to take care of her. She feels he is trying. He was never nurturing with the kids either.  ?She talked about the pain she felt when her son got married and she was left out and not supported or acknowledged by her son. She was rejected every time she let herself be vulnerable.  ?He used alcohol and work to avoid anything to do with her and the family. She says he relies on toxic cheerfulness. Says they can talk about anything "except our relationship".  ?States that her boys love their father even though he was not there for them. She is the one that raised the boys. She says that they love her even though she was left out of so much at son's wedding. Relationship with son is "cordial" and they speak every couple of weeks. ?[name]'s father was an alcoholic. Brenly says her FOO was toxic. Her dad and brothers were depressed. Brothers were addits. Father had employment problems. Her father spent  years of pretending to go to work, but would go home to go to bed after dropping them off at school. She says she had a good relationship with her father.Says she made peace with both of her parents. The relation

## 2022-03-23 ENCOUNTER — Ambulatory Visit (INDEPENDENT_AMBULATORY_CARE_PROVIDER_SITE_OTHER): Payer: Medicare Other | Admitting: Psychology

## 2022-03-23 DIAGNOSIS — F4310 Post-traumatic stress disorder, unspecified: Secondary | ICD-10-CM | POA: Diagnosis not present

## 2022-03-23 NOTE — Progress Notes (Signed)
? ? ? ? ? ? ? ? ? ? ? ? ? ? ? ? ? ? ? ? ? ?[facility] Counselor Initial Adult Exam ? ?Name: Sonya Fowler ?Date: 03/23/2022 ?MRN: [id number] ?DOB: 26-Apr-1951 ?PCP: Aletha Halim., PA-C ? ? ? ? ?[name] participated from home, via video, and consented to treatment. Therapist participated from home office. We met online due to [place] pandemic. ? ? ?Guardian/Payee:  N/A   ? ?Paperwork requested: No  ? ?Reason for Visit /Presenting Problem: low self esteem, marital conflict ? ?Mental Status Exam: ?Appearance:   Casual     ?Behavior:  Appropriate  ?Motor:  Normal  ?Speech/Language:   Normal Rate  ?Affect:  Appropriate  ?Mood:  depressed  ?Thought process:  normal  ?Thought content:    WNL  ?Sensory/Perceptual disturbances:    WNL  ?Orientation:  oriented to person, place, and time/date  ?Attention:  Good  ?Concentration:  Good  ?Memory:  WNL  ?Fund of knowledge:   Good  ?Insight:    Good  ?Judgment:   Good  ?Impulse Control:  Good  ? ? ?Reported Symptoms:  anxious,depressed, low self esteem ? ?Risk Assessment: ?Danger to Self:  No ?Self-injurious Behavior: No ?Danger to Others: No ?Duty to Warn:no ?Physical Aggression / Violence:No  ?Access to Firearms a concern: No  ?Gang Involvement:No  ?Patient / guardian was educated about steps to take if suicide or homicide risk level increases between visits: n/a ?While future psychiatric events cannot be accurately predicted, the patient does not currently require acute inpatient psychiatric care and does not currently meet [facility] involuntary commitment criteria. ? ?Substance Abuse History: ?Current substance abuse: No    ? ?Past Psychiatric History:   ?No previous psychological problems have been observed ?Outpatient Providers:psychologist ?History of Psych Hospitalization: No  ?Psychological Testing:  N/A   ? ?Abuse History:  ?Victim of: No.,  N/A    ?Report needed: No. ?Victim of Neglect:No. ?Perpetrator of emotional and N/A   ?Witness /  Exposure to Domestic Violence: No   ?Protective Services Involvement: No  ?Witness to [employer] Violence:  No  ? ?Family History:  ?Family History  ?Problem Relation Age of Onset  ? Valvular heart disease Mother   ? Atrial fibrillation Mother   ? Hypertension Father   ? Heart disease Father   ? Heart disease Sister   ? Hypertension Sister   ? Heart disease Brother   ? Hypertension Brother   ? Hypertension Brother   ? ? ?Living situation: the patient lives with their spouse ? ?Sexual Orientation: Straight ? ?Relationship Status: married  ?Name of spouse / other:[name] ?If a parent, number of children / ages:40 and 47 ? ?Support Systems: N/A ? ?Financial Stress:  No  ? ?Income/Employment/Disability: retired ? ?Military Service: No  ? ?Educational History: ?Education: college graduate ? ?Religion/Sprituality/World View: ?unknown ? ?Any cultural differences that may affect / interfere with treatment:  not applicable  ? ?Recreation/Hobbies: golf, reading,cooking, outdoors ? ?Stressors: Marital or family conflict   ? ?Strengths: Friends ? ?Barriers:  Lack of partner cooperation  ? ?Legal History: ?Pending legal issue / charges: The patient has no significant history of legal issues. ?History of legal issue / charges:  N/A ? ?Medical History/Surgical History: not reviewed ?Past Medical History:  ?Diagnosis Date  ? Chest pain   ? Depression   ? situational (PMH)  ? Family history of adverse reaction to anesthesia   ? Son had " blue extremities and was  sluggish waking up after throat surgery ."  ? Leg pain, right   ? Osteopenia   ? Proximal humerus fracture   ? displaced left  ? Rheumatoid arthritis ([place])   ? UTI (urinary tract infection)   ? Wears glasses   ? ? ?Past Surgical History:  ?Procedure Laterality Date  ? ABDOMINAL HYSTERECTOMY    ? COLONOSCOPY    ? FRACTURE SURGERY    ? KNEE ARTHROSCOPY Right   ? ORIF HUMERUS FRACTURE Left 02/28/2019  ? ORIF HUMERUS FRACTURE Left 02/28/2019  ? Procedure: OPEN REDUCTION INTERNAL  FIXATION (ORIF) PROXIMAL HUMERUS FRACTURE;  Surgeon: Justice Britain, MD;  Location: [facility];  Service: Orthopedics;  Laterality: Left;  175min ?to follow 1st case  ? PARTIAL HYSTERECTOMY    ? TONSILLECTOMY    ? WISDOM TOOTH EXTRACTION    ? WISDOM TOOTH EXTRACTION    ? ? ?Medications: ?Current Outpatient Medications  ?Medication Sig Dispense Refill  ? calcium-vitamin D (OSCAL WITH D) 500-200 MG-UNIT tablet Take 1 tablet by mouth at bedtime.    ? cetirizine (ZYRTEC) 10 MG tablet Take 10 mg by mouth daily.    ? cyclobenzaprine (FLEXERIL) 10 MG tablet Take 1 tablet (10 mg total) by mouth 3 (three) times daily as needed for muscle spasms. 30 tablet 1  ? denosumab (PROLIA) 60 MG/ML SOSY injection Inject 60 mg into the skin every 6 (six) months.    ? estrogens, conjugated, (PREMARIN) 0.3 MG tablet Take 0.3 mg by mouth daily.     ? folic acid (FOLVITE) 1 MG tablet Take 3 mg by mouth daily.     ? magnesium oxide (MAG-OX) 400 MG tablet Take 400 mg by mouth at bedtime.    ? meloxicam (MOBIC) 15 MG tablet Take 15 mg by mouth daily.    ? Omega-3 Fatty Acids (FISH OIL) 1000 MG CAPS Take 1,000 mg by mouth daily.    ? ondansetron (ZOFRAN) 4 MG tablet Take 1 tablet (4 mg total) by mouth every 8 (eight) hours as needed for nausea or vomiting. 10 tablet 0  ? OVER THE COUNTER MEDICATION Take 500 mg by mouth daily. Black Cumin Seed Oil 500 mg Capsule    ? oxyCODONE (OXY IR/ROXICODONE) 5 MG immediate release tablet Take 1-2 tablets (5-10 mg total) by mouth every 4 (four) hours as needed for severe pain. 20 tablet 0  ? traMADol (ULTRAM) 50 MG tablet Take 1 tablet (50 mg total) by mouth every 6 (six) hours as needed for moderate pain. 30 tablet 0  ? Turmeric 500 MG CAPS Take 500 mg by mouth daily.    ? ?No current facility-administered medications for this visit.  ? ? ?Allergies  ?Allergen Reactions  ? Penicillin G Rash and Other (See Comments)  ?  Did it involve swelling of the face/tongue/throat, SOB, or low BP? No ?Did it involve sudden or  severe rash/hives, skin peeling, or any reaction on the inside of your mouth or nose? #  #  #  YES  #  #  #  ?Did you need to seek medical attention at a hospital or doctor's office? No ?When did it last happen? Childhood rxn ?If all above answers are "NO", may proceed with cephalosporin use. ? ?Pt tolerated 2g ancef on 02/28/19, no rash, no reaction noted  ? Ciprofloxacin Hcl   ?  Avoid all fluoroquinolones cause tendon rupture; pt has RA.  ? Morphine Itching  ? Sulfamethoxazole Nausea And Vomiting  ?Goals: Seeks counseling to better understand  her complicated relationship with her husband and her enabling behavior. Has symptoms of depression/anxiety as a result. Will utilize [place] exploration and psychodynamic insight oriented therapy to resolve issues. Goal date is 12-23. ?Patient agrees to have session in person in provider office. ? ?session note: ?She had a good week with her sister and brother visiting. She loves gardening, but Barnabas Lister does not participate. She talked about how different they are but that she hopes they can find enough common interests to have a satisfying relationship. She wants him to understand that there will always be "an underlying sadness in the life of his wife". She says that she is insecure of herself and his love of her. She says her family has been the primary focus of her life and feels that she needs a "fuller picture" from Barnabas Lister of what he did in the past. Bottom line is "I need to know he loves me....that he has come back to me". He never directly says how he feels about her. She feels that he avoids anything romantic with her. She does not know if he has a libido at all or any interest in her. She says she has told him what she needs to be responsive to his "romantic advances". She adds that he has been kind and gentle, and is far more equitable than he was before.            ?       ? ? ?Diagnoses:  ?PTSD ? ?Plan of Care: Outpatient psychotherapy ? ? ?Marcelina Morel, PhD  11:40a-12:30p Time: 50 minutes ? ? ? ? ? ? ? ? ? ? ? ? ? ? ?

## 2022-04-04 ENCOUNTER — Ambulatory Visit: Payer: Medicare Other | Admitting: Psychology

## 2022-04-27 ENCOUNTER — Ambulatory Visit (INDEPENDENT_AMBULATORY_CARE_PROVIDER_SITE_OTHER): Payer: Medicare Other | Admitting: Psychology

## 2022-04-27 DIAGNOSIS — F4310 Post-traumatic stress disorder, unspecified: Secondary | ICD-10-CM | POA: Diagnosis not present

## 2022-04-27 NOTE — Progress Notes (Signed)
[facility] Counselor Initial Adult Exam  Name: Sonya Fowler Date: 04/27/2022 MRN: [id number] DOB: October 21, 1951 PCP: Sonya Fowler., PA-C     Sonya Fowler participated from home, via video, and consented to treatment. Therapist participated from home office. We met online due to [place] pandemic.   Guardian/Payee:  N/A    Paperwork requested: No   Reason for Visit /Presenting Problem: low self esteem, marital conflict  Mental Status Exam: Appearance:   Casual     Behavior:  Appropriate  Motor:  Normal  Speech/Language:   Normal Rate  Affect:  Appropriate  Mood:  depressed  Thought process:  normal  Thought content:    WNL  Sensory/Perceptual disturbances:    WNL  Orientation:  oriented to person, place, and time/date  Attention:  Good  Concentration:  Good  Memory:  WNL  Fund of knowledge:   Good  Insight:    Good  Judgment:   Good  Impulse Control:  Good    Reported Symptoms:  anxious,depressed, low self esteem  Risk Assessment: Danger to Self:  No Self-injurious Behavior: No Danger to Others: No Duty to Warn:no Physical Aggression / Violence:No  Access to Firearms a concern: No  Gang Involvement:No  Patient / guardian was educated about steps to take if suicide or homicide risk level increases between visits: n/a While future psychiatric events cannot be accurately predicted, the patient does not currently require acute inpatient psychiatric care and does not currently meet [facility] involuntary commitment criteria.  Substance Abuse History: Current substance abuse: No     Past Psychiatric History:   No previous psychological problems have been observed Outpatient Providers:psychologist History of Psych Hospitalization: No  Psychological Testing:  N/A    Abuse History:  Victim of: No.,  N/A    Report needed: No. Victim of Neglect:No. Perpetrator of  emotional and N/A   Witness / Exposure to Domestic Violence: No   Protective Services Involvement: No  Witness to [employer] Violence:  No   Family History:  Family History  Problem Relation Age of Onset   Valvular heart disease Mother    Atrial fibrillation Mother    Hypertension Father    Heart disease Father    Heart disease Sister    Hypertension Sister    Heart disease Brother    Hypertension Brother    Hypertension Brother     Living situation: the patient lives with their spouse  Sexual Orientation: Straight  Relationship Status: married  Name of spouse / other:Sonya Fowler If a parent, number of children / ages:40 and 37  Support Systems: N/A  Museum/gallery curator Stress:  No   Income/Employment/Disability: retired  Armed forces logistics/support/administrative officer: No   Educational History: Education: Scientist, product/process development: unknown  Any cultural differences that may affect / interfere with treatment:  not applicable   Recreation/Hobbies: golf, reading,cooking, outdoors  Stressors: Marital or family conflict    Strengths: Friends  Barriers:  Lack of partner Clinical cytogeneticist History: Pending legal issue / charges: The patient has no significant history of legal issues. History of legal issue / charges:  N/A  Medical History/Surgical History: not reviewed Past Medical History:  Diagnosis Date   Chest pain    Depression    situational (PMH)   Family history  of adverse reaction to anesthesia    Son had " blue extremities and was sluggish waking up after throat surgery ."   Leg pain, right    Osteopenia    Proximal humerus fracture    displaced left   Rheumatoid arthritis ([place])    UTI (urinary tract infection)    Wears glasses     Past Surgical History:  Procedure Laterality Date   ABDOMINAL HYSTERECTOMY     COLONOSCOPY     FRACTURE SURGERY     KNEE ARTHROSCOPY Right    ORIF HUMERUS FRACTURE Left 02/28/2019   ORIF HUMERUS FRACTURE Left 02/28/2019    Procedure: OPEN REDUCTION INTERNAL FIXATION (ORIF) PROXIMAL HUMERUS FRACTURE;  Surgeon: Sonya Britain, MD;  Location: [facility];  Service: Orthopedics;  Laterality: Left;  150mn to follow 1st case   PARTIAL HYSTERECTOMY     TONSILLECTOMY     WISDOM TOOTH EXTRACTION     WISDOM TOOTH EXTRACTION      Medications: Current Outpatient Medications  Medication Sig Dispense Refill   calcium-vitamin D (OSCAL WITH D) 500-200 MG-UNIT tablet Take 1 tablet by mouth at bedtime.     cetirizine (ZYRTEC) 10 MG tablet Take 10 mg by mouth daily.     cyclobenzaprine (FLEXERIL) 10 MG tablet Take 1 tablet (10 mg total) by mouth 3 (three) times daily as needed for muscle spasms. 30 tablet 1   denosumab (PROLIA) 60 MG/ML SOSY injection Inject 60 mg into the skin every 6 (six) months.     estrogens, conjugated, (PREMARIN) 0.3 MG tablet Take 0.3 mg by mouth daily.      folic acid (FOLVITE) 1 MG tablet Take 3 mg by mouth daily.      magnesium oxide (MAG-OX) 400 MG tablet Take 400 mg by mouth at bedtime.     meloxicam (MOBIC) 15 MG tablet Take 15 mg by mouth daily.     Omega-3 Fatty Acids (FISH OIL) 1000 MG CAPS Take 1,000 mg by mouth daily.     ondansetron (ZOFRAN) 4 MG tablet Take 1 tablet (4 mg total) by mouth every 8 (eight) hours as needed for nausea or vomiting. 10 tablet 0   OVER THE COUNTER MEDICATION Take 500 mg by mouth daily. Black Cumin Seed Oil 500 mg Capsule     oxyCODONE (OXY IR/ROXICODONE) 5 MG immediate release tablet Take 1-2 tablets (5-10 mg total) by mouth every 4 (four) hours as needed for severe pain. 20 tablet 0   traMADol (ULTRAM) 50 MG tablet Take 1 tablet (50 mg total) by mouth every 6 (six) hours as needed for moderate pain. 30 tablet 0   Turmeric 500 MG CAPS Take 500 mg by mouth daily.     No current facility-administered medications for this visit.    Allergies  Allergen Reactions   Penicillin G Rash and Other (See Comments)    Did it involve swelling of the face/tongue/throat, SOB, or  low BP? No Did it involve sudden or severe rash/hives, skin peeling, or any reaction on the inside of your mouth or nose? #  #  #  YES  #  #  #  Did you need to seek medical attention at a hospital or doctor's office? No When did it last happen? Childhood rxn If all above answers are "NO", may proceed with cephalosporin use.  Pt tolerated 2g ancef on 02/28/19, no rash, no reaction noted   Ciprofloxacin Hcl     Avoid all fluoroquinolones cause tendon rupture; pt has RA.  Morphine Itching   Sulfamethoxazole Nausea And Vomiting  Goals: Seeks counseling to better understand her complicated relationship with her husband and her enabling behavior. Has symptoms of depression/anxiety as a result. Will utilize [place] exploration and psychodynamic insight oriented therapy to resolve issues. Goal date is 12-23. Patient agrees to have session in person in provider office.  session note: She feels the relationship is "stuck". Barnabas Lister was in [place] closing up the apartment and it was difficult. She feels it was a big step for her to not participate and help. Barnabas Lister brought back lots of items, some of which her wants to keep. We discussed what she was comfortable doing with those items. There is a history in the relationship of her being dismissed, as well as her feelings and needs. When she was pregnant, night before delivery he told her to sleep on couch so "one of [place] can get some rest". We talked about trying to have a dialogue with her husband about her needs and desires. He has little to no recognition that Ha[place] is suffering and that it is a process to get over what he did to the relationship.       Diagnoses:  PTSD  Plan of Care: Outpatient psychotherapy   Marcelina Morel, PhD 11:40a-12:30p Time: 50 minutes

## 2022-05-25 ENCOUNTER — Ambulatory Visit (INDEPENDENT_AMBULATORY_CARE_PROVIDER_SITE_OTHER): Payer: Medicare Other | Admitting: Psychology

## 2022-05-25 DIAGNOSIS — F4310 Post-traumatic stress disorder, unspecified: Secondary | ICD-10-CM | POA: Diagnosis not present

## 2022-06-08 ENCOUNTER — Ambulatory Visit (INDEPENDENT_AMBULATORY_CARE_PROVIDER_SITE_OTHER): Payer: Medicare Other | Admitting: Psychology

## 2022-06-08 DIAGNOSIS — F4310 Post-traumatic stress disorder, unspecified: Secondary | ICD-10-CM

## 2022-06-08 NOTE — Progress Notes (Signed)
[facility] Counselor Initial Adult Exam  Name: Sonya Fowler Date: 06/08/2022 MRN: [id number] DOB: Jul 04, 1951 PCP: Aletha Halim., PA-C     [name] participated from home, via video, and consented to treatment. Therapist participated from home office. We met online due to [place] pandemic.   Guardian/Payee:  N/A    Paperwork requested: No   Reason for Visit /Presenting Problem: low self esteem, marital conflict  Mental Status Exam: Appearance:   Casual     Behavior:  Appropriate  Motor:  Normal  Speech/Language:   Normal Rate  Affect:  Appropriate  Mood:  depressed  Thought process:  normal  Thought content:    WNL  Sensory/Perceptual disturbances:    WNL  Orientation:  oriented to person, place, and time/date  Attention:  Good  Concentration:  Good  Memory:  WNL  Fund of knowledge:   Good  Insight:    Good  Judgment:   Good  Impulse Control:  Good    Reported Symptoms:  anxious,depressed, low self esteem  Risk Assessment: Danger to Self:  No Self-injurious Behavior: No Danger to Others: No Duty to Warn:no Physical Aggression / Violence:No  Access to Firearms a concern: No  Gang Involvement:No  Patient / guardian was educated about steps to take if suicide or homicide risk level increases between visits: n/a While future psychiatric events cannot be accurately predicted, the patient does not currently require acute inpatient psychiatric care and does not currently meet [facility] involuntary commitment criteria.  Substance Abuse History: Current substance abuse: No     Past Psychiatric History:   No previous psychological problems have been observed Outpatient Providers:psychologist History of Psych Hospitalization: No  Psychological Testing:  N/A    Abuse History:  Victim of: No.,  N/A    Report needed: No. Victim of  Neglect:No. Perpetrator of emotional and N/A   Witness / Exposure to Domestic Violence: No   Protective Services Involvement: No  Witness to [employer] Violence:  No   Family History:  Family History  Problem Relation Age of Onset   Valvular heart disease Mother    Atrial fibrillation Mother    Hypertension Father    Heart disease Father    Heart disease Sister    Hypertension Sister    Heart disease Brother    Hypertension Brother    Hypertension Brother     Living situation: the patient lives with their spouse  Sexual Orientation: Straight  Relationship Status: married  Name of spouse / other:[name] If a parent, number of children / ages:40 and 62  Support Systems: N/A  Museum/gallery curator Stress:  No   Income/Employment/Disability: retired  Armed forces logistics/support/administrative officer: No   Educational History: Education: Scientist, product/process development: unknown  Any cultural differences that may affect / interfere with treatment:  not applicable   Recreation/Hobbies: golf, reading,cooking, outdoors  Stressors: Marital or family conflict    Strengths: Friends  Barriers:  Lack of partner Clinical cytogeneticist History: Pending legal issue / charges: The patient has no significant history of legal issues. History of legal issue / charges:  N/A  Medical History/Surgical History: not reviewed Past Medical History:  Diagnosis Date  Chest pain    Depression    situational (PMH)   Family history of adverse reaction to anesthesia    Son had " blue extremities and was sluggish waking up after throat surgery ."   Leg pain, right    Osteopenia    Proximal humerus fracture    displaced left   Rheumatoid arthritis ([place])    UTI (urinary tract infection)    Wears glasses     Past Surgical History:  Procedure Laterality Date   ABDOMINAL HYSTERECTOMY     COLONOSCOPY     FRACTURE SURGERY     KNEE ARTHROSCOPY Right    ORIF HUMERUS FRACTURE Left 02/28/2019   ORIF HUMERUS  FRACTURE Left 02/28/2019   Procedure: OPEN REDUCTION INTERNAL FIXATION (ORIF) PROXIMAL HUMERUS FRACTURE;  Surgeon: Justice Britain, MD;  Location: [place];  Service: Orthopedics;  Laterality: Left;  153mn to follow 1st case   PARTIAL HYSTERECTOMY     TONSILLECTOMY     WISDOM TOOTH EXTRACTION     WISDOM TOOTH EXTRACTION      Medications: Current Outpatient Medications  Medication Sig Dispense Refill   calcium-vitamin D (OSCAL WITH D) 500-200 MG-UNIT tablet Take 1 tablet by mouth at bedtime.     cetirizine (ZYRTEC) 10 MG tablet Take 10 mg by mouth daily.     cyclobenzaprine (FLEXERIL) 10 MG tablet Take 1 tablet (10 mg total) by mouth 3 (three) times daily as needed for muscle spasms. 30 tablet 1   denosumab (PROLIA) 60 MG/ML SOSY injection Inject 60 mg into the skin every 6 (six) months.     estrogens, conjugated, (PREMARIN) 0.3 MG tablet Take 0.3 mg by mouth daily.      folic acid (FOLVITE) 1 MG tablet Take 3 mg by mouth daily.      magnesium oxide (MAG-OX) 400 MG tablet Take 400 mg by mouth at bedtime.     meloxicam (MOBIC) 15 MG tablet Take 15 mg by mouth daily.     Omega-3 Fatty Acids (FISH OIL) 1000 MG CAPS Take 1,000 mg by mouth daily.     ondansetron (ZOFRAN) 4 MG tablet Take 1 tablet (4 mg total) by mouth every 8 (eight) hours as needed for nausea or vomiting. 10 tablet 0   OVER THE COUNTER MEDICATION Take 500 mg by mouth daily. Black Cumin Seed Oil 500 mg Capsule     oxyCODONE (OXY IR/ROXICODONE) 5 MG immediate release tablet Take 1-2 tablets (5-10 mg total) by mouth every 4 (four) hours as needed for severe pain. 20 tablet 0   traMADol (ULTRAM) 50 MG tablet Take 1 tablet (50 mg total) by mouth every 6 (six) hours as needed for moderate pain. 30 tablet 0   Turmeric 500 MG CAPS Take 500 mg by mouth daily.     No current facility-administered medications for this visit.    Allergies  Allergen Reactions   Penicillin G Rash and Other (See Comments)    Did it involve swelling of the  face/tongue/throat, SOB, or low BP? No Did it involve sudden or severe rash/hives, skin peeling, or any reaction on the inside of your mouth or nose? #  #  #  YES  #  #  #  Did you need to seek medical attention at a hospital or doctor's office? No When did it last happen? Childhood rxn If all above answers are "NO", may proceed with cephalosporin use.  Pt tolerated 2g ancef on 02/28/19, no rash, no reaction noted   Ciprofloxacin Hcl  Avoid all fluoroquinolones cause tendon rupture; pt has RA.   Morphine Itching   Sulfamethoxazole Nausea And Vomiting  Goals: Seeks counseling to better understand her complicated relationship with her husband and her enabling behavior. Has symptoms of depression/anxiety as a result. Will utilize [place] exploration and psychodynamic insight oriented therapy to resolve issues. Goal date is 12-23. Patient agrees to have session in person in provider office.  session note: She is worn out after having [name]'s family visiting for the past week. His sister is like Barnabas Lister and understands why he acted like he did in the marriage. Prior to the visit, she "pushed" Barnabas Lister to read the book about relationships, as she has for 2 years. He angrily agreed and read the book. When she said :do you want to talk about it?", he siad "what is there to talk about"? She thanked him for reading the book but told him she is still angry that it took him 2 1/2 years to read the book.  He replied, "see, you are still angry". Barnabas Lister doesn't understand why Doxie isn't responsive to him at all. He is completely tone deaf to her hurt feelings. He does not ever initiate anything loving toward her. Barnabas Lister is willing to come to a session. We discussed how to use the session to help him accept going to a couple's therapist.           Diagnoses:  PTSD  Plan of Care: Outpatient psychotherapy   Marcelina Morel, PhD 9:40a-10:30p Time: 50 minutes

## 2022-06-09 NOTE — Progress Notes (Signed)
[place] Counselor Initial Adult Exam  Name: Sonya Fowler Date: 06/09/2022 MRN: [id number] DOB: [birth year] PCP: Aletha Halim., PA-C     [name] participated from home, via video, and consented to treatment. Therapist participated from home office. We met online due to [place] pandemic.   Guardian/Payee:  N/A    Paperwork requested: No   Reason for Visit /Presenting Problem: low self esteem, marital conflict  Mental Status Exam: Appearance:   Casual     Behavior:  Appropriate  Motor:  Normal  Speech/Language:   Normal Rate  Affect:  Appropriate  Mood:  depressed  Thought process:  normal  Thought content:    WNL  Sensory/Perceptual disturbances:    WNL  Orientation:  oriented to person, place, and time/date  Attention:  Good  Concentration:  Good  Memory:  WNL  Fund of knowledge:   Good  Insight:    Good  Judgment:   Good  Impulse Control:  Good    Reported Symptoms:  anxious,depressed, low self esteem  Risk Assessment: Danger to Self:  No Self-injurious Behavior: No Danger to Others: No Duty to Warn:no Physical Aggression / Violence:No  Access to Firearms a concern: No  Gang Involvement:No  Patient / guardian was educated about steps to take if suicide or homicide risk level increases between visits: n/a While future psychiatric events cannot be accurately predicted, the patient does not currently require acute inpatient psychiatric care and does not currently meet [facility] involuntary commitment criteria.  Substance Abuse History: Current substance abuse: No     Past Psychiatric History:   No previous psychological problems have been observed Outpatient Providers:psychologist History of Psych Hospitalization: No  Psychological Testing:  N/A    Abuse History:  Victim of: No.,  N/A    Report needed: No. Victim of Neglect:No. Perpetrator of  emotional and N/A   Witness / Exposure to Domestic Violence: No   Protective Services Involvement: No  Witness to [employer] Violence:  No   Family History:  Family History  Problem Relation Age of Onset   Valvular heart disease Mother    Atrial fibrillation Mother    Hypertension Father    Heart disease Father    Heart disease Sister    Hypertension Sister    Heart disease Brother    Hypertension Brother    Hypertension Brother     Living situation: the patient lives with their spouse  Sexual Orientation: Straight  Relationship Status: married  Name of spouse / other:[name] If a parent, number of children / ages:40 and 48  Support Systems: N/A  Museum/gallery curator Stress:  No   Income/Employment/Disability: retired  Armed forces logistics/support/administrative officer: No   Educational History: Education: Scientist, product/process development: unknown  Any cultural differences that may affect / interfere with treatment:  not applicable   Recreation/Hobbies: golf, reading,cooking, outdoors  Stressors: Marital or family conflict    Strengths: Friends  Barriers:  Lack of partner Clinical cytogeneticist History: Pending legal issue / charges: The patient has no significant history of legal issues. History of legal issue / charges:  N/A  Medical History/Surgical History: not reviewed Past Medical History:  Diagnosis Date   Chest pain    Depression    situational (PMH)   Family history  of adverse reaction to anesthesia    Son had " blue extremities and was sluggish waking up after throat surgery ."   Leg pain, right    Osteopenia    Proximal humerus fracture    displaced left   Rheumatoid arthritis ([place])    UTI (urinary tract infection)    Wears glasses     Past Surgical History:  Procedure Laterality Date   ABDOMINAL HYSTERECTOMY     COLONOSCOPY     FRACTURE SURGERY     KNEE ARTHROSCOPY Right    ORIF HUMERUS FRACTURE Left 02/28/2019   ORIF HUMERUS FRACTURE Left 02/28/2019    Procedure: OPEN REDUCTION INTERNAL FIXATION (ORIF) PROXIMAL HUMERUS FRACTURE;  Surgeon: Justice Britain, MD;  Location: [facility];  Service: Orthopedics;  Laterality: Left;  150mn to follow 1st case   PARTIAL HYSTERECTOMY     TONSILLECTOMY     WISDOM TOOTH EXTRACTION     WISDOM TOOTH EXTRACTION      Medications: Current Outpatient Medications  Medication Sig Dispense Refill   calcium-vitamin D (OSCAL WITH D) 500-200 MG-UNIT tablet Take 1 tablet by mouth at bedtime.     cetirizine (ZYRTEC) 10 MG tablet Take 10 mg by mouth daily.     cyclobenzaprine (FLEXERIL) 10 MG tablet Take 1 tablet (10 mg total) by mouth 3 (three) times daily as needed for muscle spasms. 30 tablet 1   denosumab (PROLIA) 60 MG/ML SOSY injection Inject 60 mg into the skin every 6 (six) months.     estrogens, conjugated, (PREMARIN) 0.3 MG tablet Take 0.3 mg by mouth daily.      folic acid (FOLVITE) 1 MG tablet Take 3 mg by mouth daily.      magnesium oxide (MAG-OX) 400 MG tablet Take 400 mg by mouth at bedtime.     meloxicam (MOBIC) 15 MG tablet Take 15 mg by mouth daily.     Omega-3 Fatty Acids (FISH OIL) 1000 MG CAPS Take 1,000 mg by mouth daily.     ondansetron (ZOFRAN) 4 MG tablet Take 1 tablet (4 mg total) by mouth every 8 (eight) hours as needed for nausea or vomiting. 10 tablet 0   OVER THE COUNTER MEDICATION Take 500 mg by mouth daily. Black Cumin Seed Oil 500 mg Capsule     oxyCODONE (OXY IR/ROXICODONE) 5 MG immediate release tablet Take 1-2 tablets (5-10 mg total) by mouth every 4 (four) hours as needed for severe pain. 20 tablet 0   traMADol (ULTRAM) 50 MG tablet Take 1 tablet (50 mg total) by mouth every 6 (six) hours as needed for moderate pain. 30 tablet 0   Turmeric 500 MG CAPS Take 500 mg by mouth daily.     No current facility-administered medications for this visit.    Allergies  Allergen Reactions   Penicillin G Rash and Other (See Comments)    Did it involve swelling of the face/tongue/throat, SOB, or  low BP? No Did it involve sudden or severe rash/hives, skin peeling, or any reaction on the inside of your mouth or nose? #  #  #  YES  #  #  #  Did you need to seek medical attention at a hospital or doctor's office? No When did it last happen? Childhood rxn If all above answers are "NO", may proceed with cephalosporin use.  Pt tolerated 2g ancef on 02/28/19, no rash, no reaction noted   Ciprofloxacin Hcl     Avoid all fluoroquinolones cause tendon rupture; pt has RA.  Morphine Itching   Sulfamethoxazole Nausea And Vomiting  Goals: Seeks counseling to better understand her complicated relationship with her husband and her enabling behavior. Has symptoms of depression/anxiety as a result. Will utilize [place] exploration and psychodynamic insight oriented therapy to resolve issues. Goal date is 12-23. Patient agrees to have session in person in provider office.  session note: Martie states that she and her husband are getting along well and that she is doing a better job at asserting herself and her nedds. Barnabas Lister, however, still appears unable to express and compassion for her feelings of grief, despair and sadness. She continues to appeal to hem to read a book that she bought 2 years ago that will provide some insight into her experience and feelings. He gets annoyed and frustrated with her. She sways she cannot be affectionate with him as it doesn't feel emotionally safe. She also has lost interest in intimacy with him. He acts "as though" everything os okay and she should be willing to put his affair in the past and not let it bother her. She reports that he is willing to come in to meet me. Suggested that we have a meeting to prepare for that conjoint session and discuss parameters. She agrees.       Diagnoses:  PTSD  Plan of Care: Outpatient psychotherapy   Marcelina Morel, PhD 9:40a-10:30p Time: 65 minutes                           Marcelina Morel,  PhD

## 2022-06-15 ENCOUNTER — Ambulatory Visit (INDEPENDENT_AMBULATORY_CARE_PROVIDER_SITE_OTHER): Payer: Medicare Other | Admitting: Psychology

## 2022-06-15 DIAGNOSIS — F4310 Post-traumatic stress disorder, unspecified: Secondary | ICD-10-CM | POA: Diagnosis not present

## 2022-06-15 NOTE — Progress Notes (Signed)
[facility] Counselor Initial Adult Exam  Name: Sonya Fowler Date: 06/15/2022 MRN: [id number] DOB: 1951-05-15 PCP: Aletha Halim., PA-C     Sonya Fowler participated from home, via video, and consented to treatment. Therapist participated from home office. We met online due to [place] pandemic.   Guardian/Payee:  N/A    Paperwork requested: No   Reason for Visit /Presenting Problem: low self esteem, marital conflict  Mental Status Exam: Appearance:   Casual     Behavior:  Appropriate  Motor:  Normal  Speech/Language:   Normal Rate  Affect:  Appropriate  Mood:  depressed  Thought process:  normal  Thought content:    WNL  Sensory/Perceptual disturbances:    WNL  Orientation:  oriented to person, place, and time/date  Attention:  Good  Concentration:  Good  Memory:  WNL  Fund of knowledge:   Good  Insight:    Good  Judgment:   Good  Impulse Control:  Good    Reported Symptoms:  anxious,depressed, low self esteem  Risk Assessment: Danger to Self:  No Self-injurious Behavior: No Danger to Others: No Duty to Warn:no Physical Aggression / Violence:No  Access to Firearms a concern: No  Gang Involvement:No  Patient / guardian was educated about steps to take if suicide or homicide risk level increases between visits: n/a While future psychiatric events cannot be accurately predicted, the patient does not currently require acute inpatient psychiatric care and does not currently meet [facility] involuntary commitment criteria.  Substance Abuse History: Current substance abuse: No     Past Psychiatric History:   No previous psychological problems have been observed Outpatient Providers:psychologist History of Psych Hospitalization: No  Psychological Testing:  N/A    Abuse History:  Victim of: No.,  N/A    Report needed: No. Victim of  Neglect:No. Perpetrator of emotional and N/A   Witness / Exposure to Domestic Violence: No   Protective Services Involvement: No  Witness to [employer] Violence:  No   Family History:  Family History  Problem Relation Age of Onset   Valvular heart disease Mother    Atrial fibrillation Mother    Hypertension Father    Heart disease Father    Heart disease Sister    Hypertension Sister    Heart disease Brother    Hypertension Brother    Hypertension Brother     Living situation: the patient lives with their spouse  Sexual Orientation: Straight  Relationship Status: married  Name of spouse / other:[name] If a parent, number of children / ages:40 and 74  Support Systems: N/A  Museum/gallery curator Stress:  No   Income/Employment/Disability: retired  Armed forces logistics/support/administrative officer: No   Educational History: Education: Scientist, product/process development: unknown  Any cultural differences that may affect / interfere with treatment:  not applicable   Recreation/Hobbies: golf, reading,cooking, outdoors  Stressors: Marital or family conflict    Strengths: Friends  Barriers:  Lack of partner Clinical cytogeneticist History: Pending legal issue / charges: The patient has no significant history of legal issues. History of legal issue / charges:  N/A  Medical History/Surgical History: not reviewed Past Medical History:  Diagnosis Date   Chest pain    Depression  situational (PMH)   Family history of adverse reaction to anesthesia    Son had " blue extremities and was sluggish waking up after throat surgery ."   Leg pain, right    Osteopenia    Proximal humerus fracture    displaced left   Rheumatoid arthritis ([place])    UTI (urinary tract infection)    Wears glasses     Past Surgical History:  Procedure Laterality Date   ABDOMINAL HYSTERECTOMY     COLONOSCOPY     FRACTURE SURGERY     KNEE ARTHROSCOPY Right    ORIF HUMERUS FRACTURE Left 02/28/2019   ORIF HUMERUS  FRACTURE Left 02/28/2019   Procedure: OPEN REDUCTION INTERNAL FIXATION (ORIF) PROXIMAL HUMERUS FRACTURE;  Surgeon: Justice Britain, MD;  Location: [facility];  Service: Orthopedics;  Laterality: Left;  150min to follow 1st case   PARTIAL HYSTERECTOMY     TONSILLECTOMY     WISDOM TOOTH EXTRACTION     WISDOM TOOTH EXTRACTION      Medications: Current Outpatient Medications  Medication Sig Dispense Refill   calcium-vitamin D (OSCAL WITH D) 500-200 MG-UNIT tablet Take 1 tablet by mouth at bedtime.     cetirizine (ZYRTEC) 10 MG tablet Take 10 mg by mouth daily.     cyclobenzaprine (FLEXERIL) 10 MG tablet Take 1 tablet (10 mg total) by mouth 3 (three) times daily as needed for muscle spasms. 30 tablet 1   denosumab (PROLIA) 60 MG/ML SOSY injection Inject 60 mg into the skin every 6 (six) months.     estrogens, conjugated, (PREMARIN) 0.3 MG tablet Take 0.3 mg by mouth daily.      folic acid (FOLVITE) 1 MG tablet Take 3 mg by mouth daily.      magnesium oxide (MAG-OX) 400 MG tablet Take 400 mg by mouth at bedtime.     meloxicam (MOBIC) 15 MG tablet Take 15 mg by mouth daily.     Omega-3 Fatty Acids (FISH OIL) 1000 MG CAPS Take 1,000 mg by mouth daily.     ondansetron (ZOFRAN) 4 MG tablet Take 1 tablet (4 mg total) by mouth every 8 (eight) hours as needed for nausea or vomiting. 10 tablet 0   OVER THE COUNTER MEDICATION Take 500 mg by mouth daily. Black Cumin Seed Oil 500 mg Capsule     oxyCODONE (OXY IR/ROXICODONE) 5 MG immediate release tablet Take 1-2 tablets (5-10 mg total) by mouth every 4 (four) hours as needed for severe pain. 20 tablet 0   traMADol (ULTRAM) 50 MG tablet Take 1 tablet (50 mg total) by mouth every 6 (six) hours as needed for moderate pain. 30 tablet 0   Turmeric 500 MG CAPS Take 500 mg by mouth daily.     No current facility-administered medications for this visit.    Allergies  Allergen Reactions   Penicillin G Rash and Other (See Comments)    Did it involve swelling of the  face/tongue/throat, SOB, or low BP? No Did it involve sudden or severe rash/hives, skin peeling, or any reaction on the inside of your mouth or nose? #  #  #  YES  #  #  #  Did you need to seek medical attention at a hospital or doctor's office? No When did it last happen? Childhood rxn If all above answers are "NO", may proceed with cephalosporin use.  Pt tolerated 2g ancef on 02/28/19, no rash, no reaction noted   Ciprofloxacin Hcl     Avoid all fluoroquinolones cause tendon  rupture; pt has RA.   Morphine Itching   Sulfamethoxazole Nausea And Vomiting  Goals: Seeks counseling to better understand her complicated relationship with her husband and her enabling behavior. Has symptoms of depression/anxiety as a result. Will utilize [place] exploration and psychodynamic insight oriented therapy to resolve issues. Goal date is 12-23. Patient agrees to have session in person in provider office.  session note: Barnabas Lister: He says getting back together was difficult because he had a life in [place]. He did not plan well for retirement. Living together after 15 years of living apart was a significant adjustment. He says the relationship seems to go along okay and "then it's not". He saw a therapist shortly after retirement (about 2 years ago) recommended by wife's therapist. They also went to a marital counselor about 6 times and it was not helpful. His counselor Abbe Amsterdam) told him "you are very different people in many ways". He is not a "warm and fuzzy" person and Abbe Amsterdam characterized it as Sonya Fowler going to [place] and wants to buy milk and eggs. He feels like he has changed in many ways but it is not enough for her. He feels she expects him to be something he is not. Part of him feels "when does the statute of limitations run out"? He did read a book that Akayla wanted him to read. His take away is that trust is the biggest breach after an affair. The other pieces are empathy and the need to hear her distress.               Diagnoses:  PTSD  Plan of Care: Outpatient psychotherapy   Marcelina Morel, PhD 3:10p-4:00p Time: 50 minutes

## 2022-07-06 ENCOUNTER — Ambulatory Visit (INDEPENDENT_AMBULATORY_CARE_PROVIDER_SITE_OTHER): Payer: Medicare Other | Admitting: Psychology

## 2022-07-06 DIAGNOSIS — F4310 Post-traumatic stress disorder, unspecified: Secondary | ICD-10-CM | POA: Diagnosis not present

## 2022-07-06 NOTE — Progress Notes (Deleted)
[facility] Counselor Initial Adult Exam  Name: Sonya Fowler Date: 07/06/2022 MRN: [id number] DOB: 1951/02/08 PCP: Aletha Halim., PA-C     [name] participated from home, via video, and consented to treatment. Therapist participated from home office. We met online due to [place] pandemic.   Guardian/Payee:  N/A    Paperwork requested: No   Reason for Visit /Presenting Problem: low self esteem, marital conflict  Mental Status Exam: Appearance:   Casual     Behavior:  Appropriate  Motor:  Normal  Speech/Language:   Normal Rate  Affect:  Appropriate  Mood:  depressed  Thought process:  normal  Thought content:    WNL  Sensory/Perceptual disturbances:    WNL  Orientation:  oriented to person, place, and time/date  Attention:  Good  Concentration:  Good  Memory:  WNL  Fund of knowledge:   Good  Insight:    Good  Judgment:   Good  Impulse Control:  Good    Reported Symptoms:  anxious,depressed, low self esteem  Risk Assessment: Danger to Self:  No Self-injurious Behavior: No Danger to Others: No Duty to Warn:no Physical Aggression / Violence:No  Access to Firearms a concern: No  Gang Involvement:No  Patient / guardian was educated about steps to take if suicide or homicide risk level increases between visits: n/a While future psychiatric events cannot be accurately predicted, the patient does not currently require acute inpatient psychiatric care and does not currently meet [facility] involuntary commitment criteria.  Substance Abuse History: Current substance abuse: No     Past Psychiatric History:   No previous psychological problems have been observed Outpatient Providers:psychologist History of Psych Hospitalization: No  Psychological Testing:  N/A    Abuse  History:  Victim of: No.,  N/A    Report needed: No. Victim of Neglect:No. Perpetrator of emotional and N/A   Witness / Exposure to Domestic Violence: No   Protective Services Involvement: No  Witness to [employer] Violence:  No   Family History:  Family History  Problem Relation Age of Onset   Valvular heart disease Mother    Atrial fibrillation Mother    Hypertension Father    Heart disease Father    Heart disease Sister    Hypertension Sister    Heart disease Brother    Hypertension Brother    Hypertension Brother     Living situation: the patient lives with their spouse  Sexual Orientation: Straight  Relationship Status: married  Name of spouse / other:[name] If a parent, number of children / ages:40 and 69  Support Systems: N/A  Museum/gallery curator Stress:  No   Income/Employment/Disability: retired  Armed forces logistics/support/administrative officer: No   Educational History: Education: Scientist, product/process development: unknown  Any cultural differences that may affect / interfere with treatment:  not applicable   Recreation/Hobbies: golf, reading,cooking, outdoors  Stressors: Marital or family conflict    Strengths: Friends  Barriers:  Lack of partner Clinical cytogeneticist History: Pending legal issue / charges: The  patient has no significant history of legal issues. History of legal issue / charges:  N/A  Medical History/Surgical History: not reviewed Past Medical History:  Diagnosis Date   Chest pain    Depression    situational (PMH)   Family history of adverse reaction to anesthesia    Son had " blue extremities and was sluggish waking up after throat surgery ."   Leg pain, right    Osteopenia    Proximal humerus fracture    displaced left   Rheumatoid arthritis ([place])    UTI (urinary tract infection)    Wears glasses     Past Surgical History:  Procedure Laterality Date   ABDOMINAL HYSTERECTOMY     COLONOSCOPY     FRACTURE SURGERY     KNEE ARTHROSCOPY Right     ORIF HUMERUS FRACTURE Left 02/28/2019   ORIF HUMERUS FRACTURE Left 02/28/2019   Procedure: OPEN REDUCTION INTERNAL FIXATION (ORIF) PROXIMAL HUMERUS FRACTURE;  Surgeon: Justice Britain, MD;  Location: [facility];  Service: Orthopedics;  Laterality: Left;  144min to follow 1st case   PARTIAL HYSTERECTOMY     TONSILLECTOMY     WISDOM TOOTH EXTRACTION     WISDOM TOOTH EXTRACTION      Medications: Current Outpatient Medications  Medication Sig Dispense Refill   calcium-vitamin D (OSCAL WITH D) 500-200 MG-UNIT tablet Take 1 tablet by mouth at bedtime.     cetirizine (ZYRTEC) 10 MG tablet Take 10 mg by mouth daily.     cyclobenzaprine (FLEXERIL) 10 MG tablet Take 1 tablet (10 mg total) by mouth 3 (three) times daily as needed for muscle spasms. 30 tablet 1   denosumab (PROLIA) 60 MG/ML SOSY injection Inject 60 mg into the skin every 6 (six) months.     estrogens, conjugated, (PREMARIN) 0.3 MG tablet Take 0.3 mg by mouth daily.      folic acid (FOLVITE) 1 MG tablet Take 3 mg by mouth daily.      magnesium oxide (MAG-OX) 400 MG tablet Take 400 mg by mouth at bedtime.     meloxicam (MOBIC) 15 MG tablet Take 15 mg by mouth daily.     Omega-3 Fatty Acids (FISH OIL) 1000 MG CAPS Take 1,000 mg by mouth daily.     ondansetron (ZOFRAN) 4 MG tablet Take 1 tablet (4 mg total) by mouth every 8 (eight) hours as needed for nausea or vomiting. 10 tablet 0   OVER THE COUNTER MEDICATION Take 500 mg by mouth daily. Black Cumin Seed Oil 500 mg Capsule     oxyCODONE (OXY IR/ROXICODONE) 5 MG immediate release tablet Take 1-2 tablets (5-10 mg total) by mouth every 4 (four) hours as needed for severe pain. 20 tablet 0   traMADol (ULTRAM) 50 MG tablet Take 1 tablet (50 mg total) by mouth every 6 (six) hours as needed for moderate pain. 30 tablet 0   Turmeric 500 MG CAPS Take 500 mg by mouth daily.     No current facility-administered medications for this visit.    Allergies  Allergen Reactions   Penicillin G Rash and  Other (See Comments)    Did it involve swelling of the face/tongue/throat, SOB, or low BP? No Did it involve sudden or severe rash/hives, skin peeling, or any reaction on the inside of your mouth or nose? #  #  #  YES  #  #  #  Did you need to seek medical attention at a hospital or doctor's office? No When did it last  happen? Childhood rxn If all above answers are "NO", may proceed with cephalosporin use.  Pt tolerated 2g ancef on 02/28/19, no rash, no reaction noted   Ciprofloxacin Hcl     Avoid all fluoroquinolones cause tendon rupture; pt has RA.   Morphine Itching   Sulfamethoxazole Nausea And Vomiting  Goals: Seeks counseling to better understand her complicated relationship with her husband and her enabling behavior. Has symptoms of depression/anxiety as a result. Will utilize [place] exploration and psychodynamic insight oriented therapy to resolve issues. Goal date is 12-23. Patient agrees to have session in person in provider office.  session note: Barnabas Lister: He says getting back together was difficult because he had a life in [place]. He did not plan well for retirement. Living together after 15 years of living apart was a significant adjustment. He says the relationship seems to go along okay and "then it's not". He saw a therapist shortly after retirement (about 2 years ago) recommended by wife's therapist. They also went to a marital counselor about 6 times and it was not helpful. His counselor Abbe Amsterdam) told him "you are very different people in many ways". He is not a "warm and fuzzy" person and Abbe Amsterdam characterized it as Jannis going to [place] and wants to buy milk and eggs. He feels like he has changed in many ways but it is not enough for her. He feels she expects him to be something he is not. Part of him feels "when does the statute of limitations run out"? He did read a book that Koula wanted him to read. His take away is that trust is the biggest breach after an affair. The other  pieces are empathy and the need to hear her distress.              Diagnoses:  PTSD  Plan of Care: Outpatient psychotherapy   Marcelina Morel, PhD 3:10p-4:00p Time: 50 minutes

## 2022-07-06 NOTE — Progress Notes (Signed)
[facility] Counselor Initial Adult Exam  Name: Sonya Fowler Date: 07/06/2022 MRN: [id number] DOB: 1951/03/12 PCP: Richmond Campbell., PA-C        Guardian/Payee:  N/A    Paperwork requested: No   Reason for Visit /Presenting Problem: low self esteem, marital conflict  Mental Status Exam: Appearance:   Casual     Behavior:  Appropriate  Motor:  Normal  Speech/Language:   Normal Rate  Affect:  Appropriate  Mood:  depressed  Thought process:  normal  Thought content:    WNL  Sensory/Perceptual disturbances:    WNL  Orientation:  oriented to person, place, and time/date  Attention:  Good  Concentration:  Good  Memory:  WNL  Fund of knowledge:   Good  Insight:    Good  Judgment:   Good  Impulse Control:  Good    Reported Symptoms:  anxious,depressed, low self esteem  Risk Assessment: Danger to Self:  No Self-injurious Behavior: No Danger to Others: No Duty to Warn:no Physical Aggression / Violence:No  Access to Firearms a concern: No  Gang Involvement:No  Patient / guardian was educated about steps to take if suicide or homicide risk level increases between visits: n/a While future psychiatric events cannot be accurately predicted, the patient does not currently require acute inpatient psychiatric care and does not currently meet [facility] involuntary commitment criteria.  Substance Abuse History: Current substance abuse: No     Past Psychiatric History:   No previous psychological problems have been observed Outpatient Providers:psychologist History of Psych Hospitalization: No  Psychological Testing:  N/A    Abuse History:  Victim of: No.,  N/A    Report needed: No. Victim of Neglect:No. Perpetrator of emotional and N/A   Witness / Exposure to Domestic Violence: No    Protective Services Involvement: No  Witness to [employer] Violence:  No   Family History:  Family History  Problem Relation Age of Onset   Valvular heart disease Mother    Atrial fibrillation Mother    Hypertension Father    Heart disease Father    Heart disease Sister    Hypertension Sister    Heart disease Brother    Hypertension Brother    Hypertension Brother     Living situation: the patient lives with their spouse  Sexual Orientation: Straight  Relationship Status: married  Name of spouse / other:[name] If a parent, number of children / ages:40 and 45  Support Systems: N/A  Surveyor, quantity Stress:  No   Income/Employment/Disability: retired  Financial planner: No   Educational History: Education: Risk manager: unknown  Any cultural differences that may affect / interfere with treatment:  not applicable   Recreation/Hobbies: golf, reading,cooking, outdoors  Stressors: Marital or family conflict    Strengths: Friends  Barriers:  Lack of partner Electronics engineer History: Pending legal issue / charges: The patient has no significant history of legal issues. History of legal issue / charges:  N/A  Medical History/Surgical History: not reviewed Past  Medical History:  Diagnosis Date   Chest pain    Depression    situational (PMH)   Family history of adverse reaction to anesthesia    Son had " blue extremities and was sluggish waking up after throat surgery ."   Leg pain, right    Osteopenia    Proximal humerus fracture    displaced left   Rheumatoid arthritis ([facility])    UTI (urinary tract infection)    Wears glasses     Past Surgical History:  Procedure Laterality Date   ABDOMINAL HYSTERECTOMY     COLONOSCOPY     FRACTURE SURGERY     KNEE ARTHROSCOPY Right    ORIF HUMERUS FRACTURE Left 02/28/2019   ORIF HUMERUS FRACTURE Left 02/28/2019   Procedure: OPEN REDUCTION INTERNAL FIXATION (ORIF) PROXIMAL HUMERUS  FRACTURE;  Surgeon: Francena Hanly, MD;  Location[facility];  Service: Orthopedics;  Laterality: Left;  to follow 1st case   PARTIAL HYSTERECTOMY     TONSILLECTOMY     WISDOM TOOTH EXTRACTION     WISDOM TOOTH EXTRACTION      Medications: Current Outpatient Medications  Medication Sig Dispense Refill   calcium-vitamin D (OSCAL WITH D) 500-200 MG-UNIT tablet Take 1 tablet by mouth at bedtime.     cetirizine (ZYRTEC) 10 MG tablet Take 10 mg by mouth daily.     cyclobenzaprine (FLEXERIL) 10 MG tablet Take 1 tablet (10 mg total) by mouth 3 (three) times daily as needed for muscle spasms. 30 tablet 1   denosumab (PROLIA) 60 MG/ML SOSY injection Inject 60 mg into the skin every 6 (six) months.     estrogens, conjugated, (PREMARIN) 0.3 MG tablet Take 0.3 mg by mouth daily.      folic acid (FOLVITE) 1 MG tablet Take 3 mg by mouth daily.      magnesium oxide (MAG-OX) 400 MG tablet Take 400 mg by mouth at bedtime.     meloxicam (MOBIC) 15 MG tablet Take 15 mg by mouth daily.     Omega-3 Fatty Acids (FISH OIL) 1000 MG CAPS Take 1,000 mg by mouth daily.     ondansetron (ZOFRAN) 4 MG tablet Take 1 tablet (4 mg total) by mouth every 8 (eight) hours as needed for nausea or vomiting. 10 tablet 0   OVER THE COUNTER MEDICATION Take 500 mg by mouth daily. Black Cumin Seed Oil 500 mg Capsule     oxyCODONE (OXY IR/ROXICODONE) 5 MG immediate release tablet Take 1-2 tablets (5-10 mg total) by mouth every 4 (four) hours as needed for severe pain. 20 tablet 0   traMADol (ULTRAM) 50 MG tablet Take 1 tablet (50 mg total) by mouth every 6 (six) hours as needed for moderate pain. 30 tablet 0   Turmeric 500 MG CAPS Take 500 mg by mouth daily.     No current facility-administered medications for this visit.    Allergies  Allergen Reactions   Penicillin G Rash and Other (See Comments)    Did it involve swelling of the face/tongue/throat, SOB, or low BP? No Did it involve sudden or severe rash/hives, skin peeling,  or any reaction on the inside of your mouth or nose? #  #  #  YES  #  #  #  Did you need to seek medical attention at a hospital or doctor's office? No When did it last happen? Childhood rxn If all above answers are "NO", may proceed with cephalosporin use.  Pt tolerated 2g ancef on 02/28/19, no rash,  no reaction noted   Ciprofloxacin Hcl     Avoid all fluoroquinolones cause tendon rupture; pt has RA.   Morphine Itching   Sulfamethoxazole Nausea And Vomiting  Goals: Seeks counseling to better understand her complicated relationship with her husband and her enabling behavior. Has symptoms of depression/anxiety as a result. Will utilize [place] exploration and psychodynamic insight oriented therapy to resolve issues. Goal date is 12-23. Patient agrees to have session in person in provider office.  session note: We discussed my individual session with Ree Kida. In particular, we talked about accomodation and his lack of meeting her in the middle. She says every day she has to suppress a feeling. She thinks getting hm to change is an act of futility. We talked about the fact that there is not a "statute of limitations" and that her feelings can be triggered at any time. He needs to "hang in there with her" to help her manage those situations. She wonders if he lacks empathy and remorse. She feels that Ree Kida has never been able to comfort her. Se has looked to him for comfort in the past and he just lets her down. She does not feel she an rely on him.   She wants an desires love, and it is very liited with jack. She is tired of being vulnerable and disappointed.  Diagnoses:  PTSD  Plan of Care: Outpatient psychotherapy   Garrel Ridgel, PhD 3:10p-4:00p Time: 50 minutes

## 2022-07-20 ENCOUNTER — Ambulatory Visit (INDEPENDENT_AMBULATORY_CARE_PROVIDER_SITE_OTHER): Payer: Medicare Other | Admitting: Psychology

## 2022-07-20 DIAGNOSIS — F4310 Post-traumatic stress disorder, unspecified: Secondary | ICD-10-CM | POA: Diagnosis not present
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
## 2022-08-17 ENCOUNTER — Ambulatory Visit (INDEPENDENT_AMBULATORY_CARE_PROVIDER_SITE_OTHER): Payer: Medicare Other | Admitting: Psychology

## 2022-08-17 DIAGNOSIS — F4310 Post-traumatic stress disorder, unspecified: Secondary | ICD-10-CM | POA: Diagnosis not present
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
## 2022-08-24 ENCOUNTER — Ambulatory Visit: Payer: Medicare Other | Admitting: Psychology
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
## 2022-08-31 ENCOUNTER — Ambulatory Visit (INDEPENDENT_AMBULATORY_CARE_PROVIDER_SITE_OTHER): Payer: Medicare Other | Admitting: Psychology

## 2022-08-31 DIAGNOSIS — F4310 Post-traumatic stress disorder, unspecified: Secondary | ICD-10-CM

## 2022-08-31 NOTE — Progress Notes (Signed)
Guardian/Payee:  N/A    Paperwork requested: No   Reason for Visit /Presenting Problem: low self esteem, marital conflict  Mental Status Exam: Appearance:   Casual     Behavior:  Appropriate  Motor:  Normal  Speech/Language:   Normal Rate  Affect:  Appropriate  Mood:  depressed  Thought process:  normal  Thought content:    WNL  Sensory/Perceptual disturbances:    WNL  Orientation:  oriented to person, place, and time/date  Attention:  Good  Concentration:  Good  Memory:  WNL  Fund of knowledge:   Good  Insight:    Good  Judgment:   Good  Impulse Control:  Good    Reported Symptoms:  anxious,depressed, low self esteem  Risk Assessment: Danger to Self:  No Self-injurious Behavior: No Danger to Others: No Duty to Warn:no Physical Aggression / Violence:No  Access to Firearms a concern: No  Gang Involvement:No  Patient / guardian was educated about steps to take if suicide or homicide risk level increases between visits: n/a While future psychiatric events cannot be accurately predicted, the patient does not currently require acute inpatient psychiatric care and does not currently meet [facility] involuntary commitment criteria.  Substance Abuse History: Current substance abuse: No     Past Psychiatric History:   No previous psychological problems have been observed Outpatient Providers:psychologist History of Psych Hospitalization: No  Psychological Testing:  N/A    Abuse History:  Victim of: No.,  N/A    Report needed: No. Victim of Neglect:No. Perpetrator of emotional and N/A   Witness / Exposure to Domestic Violence: No   Protective Services Involvement: No  Witness to [employer] Violence:  No   Family History:  Family History  Problem Relation Age of Onset   Valvular heart disease Mother    Atrial fibrillation Mother    Hypertension Father    Heart disease Father    Heart disease Sister    Hypertension Sister     Heart disease Brother    Hypertension Brother    Hypertension Brother     Living situation: the patient lives with their spouse  Sexual Orientation: Straight  Relationship Status: married  Name of spouse / other:[name] If a parent, number of children / ages:40 and 79  Support Systems: N/A  Surveyor, quantity Stress:  No   Income/Employment/Disability: retired  Financial planner: No   Educational History: Education: Risk manager: unknown  Any cultural differences that may affect / interfere with treatment:  not applicable   Recreation/Hobbies: golf, reading,cooking, outdoors  Stressors: Marital or family conflict    Strengths: Friends  Barriers:  Lack of partner Electronics engineer History: Pending legal issue / charges: The patient has no significant history of legal issues. History of legal issue / charges:  N/A  Medical History/Surgical History: not reviewed Past Medical History:  Diagnosis Date   Chest pain    Depression    situational (PMH)   Family history of adverse reaction to anesthesia    Son had " blue extremities and was sluggish waking up after throat surgery ."   Leg pain, right    Osteopenia    Proximal humerus fracture    displaced left   Rheumatoid arthritis (HCC)    UTI (urinary tract infection)    Wears glasses     Past Surgical History:  Procedure Laterality Date   ABDOMINAL  HYSTERECTOMY     COLONOSCOPY     FRACTURE SURGERY     KNEE ARTHROSCOPY Right    ORIF HUMERUS FRACTURE Left 02/28/2019   ORIF HUMERUS FRACTURE Left 02/28/2019   Procedure: OPEN REDUCTION INTERNAL FIXATION (ORIF) PROXIMAL HUMERUS FRACTURE;  Surgeon: Francena Hanly, MD;  Location: [facility] OR;  Service: Orthopedics;  Laterality: Left;  to follow 1st case   PARTIAL HYSTERECTOMY     TONSILLECTOMY     WISDOM TOOTH EXTRACTION     WISDOM TOOTH EXTRACTION      Medications: Current Outpatient Medications  Medication Sig Dispense Refill    calcium-vitamin D (OSCAL WITH D) 500-200 MG-UNIT tablet Take 1 tablet by mouth at bedtime.     cetirizine (ZYRTEC) 10 MG tablet Take 10 mg by mouth daily.     cyclobenzaprine (FLEXERIL) 10 MG tablet Take 1 tablet (10 mg total) by mouth 3 (three) times daily as needed for muscle spasms. 30 tablet 1   denosumab (PROLIA) 60 MG/ML SOSY injection Inject 60 mg into the skin every 6 (six) months.     estrogens, conjugated, (PREMARIN) 0.3 MG tablet Take 0.3 mg by mouth daily.      folic acid (FOLVITE) 1 MG tablet Take 3 mg by mouth daily.      magnesium oxide (MAG-OX) 400 MG tablet Take 400 mg by mouth at bedtime.     meloxicam (MOBIC) 15 MG tablet Take 15 mg by mouth daily.     Omega-3 Fatty Acids (FISH OIL) 1000 MG CAPS Take 1,000 mg by mouth daily.     ondansetron (ZOFRAN) 4 MG tablet Take 1 tablet (4 mg total) by mouth every 8 (eight) hours as needed for nausea or vomiting. 10 tablet 0   OVER THE COUNTER MEDICATION Take 500 mg by mouth daily. Black Cumin Seed Oil 500 mg Capsule     oxyCODONE (OXY IR/ROXICODONE) 5 MG immediate release tablet Take 1-2 tablets (5-10 mg total) by mouth every 4 (four) hours as needed for severe pain. 20 tablet 0   traMADol (ULTRAM) 50 MG tablet Take 1 tablet (50 mg total) by mouth every 6 (six) hours as needed for moderate pain. 30 tablet 0   Turmeric 500 MG CAPS Take 500 mg by mouth daily.     No current facility-administered medications for this visit.    Allergies  Allergen Reactions   Penicillin G Rash and Other (See Comments)    Did it involve swelling of the face/tongue/throat, SOB, or low BP? No Did it involve sudden or severe rash/hives, skin peeling, or any reaction on the inside of your mouth or nose? #  #  #  YES  #  #  #  Did you need to seek medical attention at a hospital or doctor's office? No When did it last happen? Childhood rxn If all above answers are "NO", may proceed with cephalosporin use.  Pt tolerated 2g ancef on 02/28/19, no rash, no  reaction noted   Ciprofloxacin Hcl     Avoid all fluoroquinolones cause tendon rupture; pt has RA.   Morphine Itching   Sulfamethoxazole Nausea And Vomiting  Goals: Seeks counseling to better understand her complicated relationship with her husband and her enabling behavior. Has symptoms of depression/anxiety as a result. Will utilize FOO exploration and psychodynamic insight oriented therapy to resolve issues. Goal date is 12-23. Patient agrees to have session in person in provider office.  session note (couple): She and Ree Kida discussed how to address difficult situations. We practiced  ways to share their experiences and feelings with each other. Janese Banks that he needs to tolerate some of the difficult discussions and that he needs to lean in and ask more of Berta to further his understanding of her experience and allow her the opportunity to express. He feels that she will not let go of issues and tends to shut down the discussion. Feels there is nother he can do to "make it better". Told him that is not the goal and that they ned to address issues in order to let them go.                   Diagnoses:  PTSD  Plan of Care: Outpatient psychotherapy   Marcelina Morel, PhD 8:35a-9:30ab Time: 55 minutes

## 2022-09-14 ENCOUNTER — Ambulatory Visit: Payer: Medicare Other | Admitting: Psychology

## 2022-09-28 ENCOUNTER — Ambulatory Visit (INDEPENDENT_AMBULATORY_CARE_PROVIDER_SITE_OTHER): Payer: Medicare Other | Admitting: Psychology

## 2022-09-28 DIAGNOSIS — F4310 Post-traumatic stress disorder, unspecified: Secondary | ICD-10-CM | POA: Diagnosis not present

## 2022-09-28 NOTE — Progress Notes (Signed)
Guardian/Payee:  N/A    Paperwork requested: No   Reason for Visit /Presenting Problem: low self esteem, marital conflict  Mental Status Exam: Appearance:   Casual     Behavior:  Appropriate  Motor:  Normal  Speech/Language:   Normal Rate  Affect:  Appropriate  Mood:  depressed  Thought process:  normal  Thought content:    WNL  Sensory/Perceptual disturbances:    WNL  Orientation:  oriented to person, place, and time/date  Attention:  Good  Concentration:  Good  Memory:  WNL  Fund of knowledge:   Good  Insight:    Good  Judgment:   Good  Impulse Control:  Good    Reported Symptoms:  anxious,depressed, low self esteem  Risk Assessment: Danger to Self:  No Self-injurious Behavior: No Danger to Others: No Duty to Warn:no Physical Aggression / Violence:No  Access to Firearms a concern: No  Gang Involvement:No  Patient / guardian was educated about steps to take if suicide or homicide risk level increases between visits: n/a While future psychiatric events cannot be accurately predicted, the patient does not currently require acute inpatient psychiatric care and does not currently meet [facility] involuntary commitment criteria.  Substance Abuse History: Current substance abuse: No     Past Psychiatric History:   No previous psychological problems have been observed Outpatient Providers:psychologist History of Psych Hospitalization: No  Psychological Testing:  N/A    Abuse History:  Victim of: No.,  N/A    Report needed: No. Victim of Neglect:No. Perpetrator of emotional and N/A   Witness / Exposure to Domestic Violence: No   Protective Services Involvement: No  Witness to [employer] Violence:  No   Family History:  Family History  Problem Relation Age of Onset   Valvular heart disease Mother    Atrial fibrillation Mother    Hypertension Father    Heart disease Father    Heart disease Sister     Hypertension Sister    Heart disease Brother    Hypertension Brother    Hypertension Brother     Living situation: the patient lives with their spouse  Sexual Orientation: Straight  Relationship Status: married  Name of spouse / other:[name] If a parent, number of children / ages:40 and 48  Support Systems: N/A  Surveyor, quantity Stress:  No   Income/Employment/Disability: retired  Financial planner: No   Educational History: Education: Risk manager: unknown  Any cultural differences that may affect / interfere with treatment:  not applicable   Recreation/Hobbies: golf, reading,cooking, outdoors  Stressors: Marital or family conflict    Strengths: Friends  Barriers:  Lack of partner Electronics engineer History: Pending legal issue / charges: The patient has no significant history of legal issues. History of legal issue / charges:  N/A  Medical History/Surgical History: not reviewed Past Medical History:  Diagnosis Date   Chest pain    Depression    situational (PMH)   Family history of adverse reaction to anesthesia    Son had " blue extremities and was sluggish waking up after throat surgery ."   Leg pain, right    Osteopenia    Proximal humerus fracture    displaced left   Rheumatoid arthritis (HCC)    UTI (urinary tract infection)    Wears  glasses     Past Surgical History:  Procedure Laterality Date   ABDOMINAL HYSTERECTOMY     COLONOSCOPY     FRACTURE SURGERY     KNEE ARTHROSCOPY Right    ORIF HUMERUS FRACTURE Left 02/28/2019   ORIF HUMERUS FRACTURE Left 02/28/2019   Procedure: OPEN REDUCTION INTERNAL FIXATION (ORIF) PROXIMAL HUMERUS FRACTURE;  Surgeon: Francena Hanly, MD;  Location: [facility] OR;  Service: Orthopedics;  Laterality: Left;  to follow 1st case   PARTIAL HYSTERECTOMY     TONSILLECTOMY     WISDOM TOOTH EXTRACTION     WISDOM TOOTH EXTRACTION      Medications: Current Outpatient Medications   Medication Sig Dispense Refill   calcium-vitamin D (OSCAL WITH D) 500-200 MG-UNIT tablet Take 1 tablet by mouth at bedtime.     cetirizine (ZYRTEC) 10 MG tablet Take 10 mg by mouth daily.     cyclobenzaprine (FLEXERIL) 10 MG tablet Take 1 tablet (10 mg total) by mouth 3 (three) times daily as needed for muscle spasms. 30 tablet 1   denosumab (PROLIA) 60 MG/ML SOSY injection Inject 60 mg into the skin every 6 (six) months.     estrogens, conjugated, (PREMARIN) 0.3 MG tablet Take 0.3 mg by mouth daily.      folic acid (FOLVITE) 1 MG tablet Take 3 mg by mouth daily.      magnesium oxide (MAG-OX) 400 MG tablet Take 400 mg by mouth at bedtime.     meloxicam (MOBIC) 15 MG tablet Take 15 mg by mouth daily.     Omega-3 Fatty Acids (FISH OIL) 1000 MG CAPS Take 1,000 mg by mouth daily.     ondansetron (ZOFRAN) 4 MG tablet Take 1 tablet (4 mg total) by mouth every 8 (eight) hours as needed for nausea or vomiting. 10 tablet 0   OVER THE COUNTER MEDICATION Take 500 mg by mouth daily. Black Cumin Seed Oil 500 mg Capsule     oxyCODONE (OXY IR/ROXICODONE) 5 MG immediate release tablet Take 1-2 tablets (5-10 mg total) by mouth every 4 (four) hours as needed for severe pain. 20 tablet 0   traMADol (ULTRAM) 50 MG tablet Take 1 tablet (50 mg total) by mouth every 6 (six) hours as needed for moderate pain. 30 tablet 0   Turmeric 500 MG CAPS Take 500 mg by mouth daily.     No current facility-administered medications for this visit.    Allergies  Allergen Reactions   Penicillin G Rash and Other (See Comments)    Did it involve swelling of the face/tongue/throat, SOB, or low BP? No Did it involve sudden or severe rash/hives, skin peeling, or any reaction on the inside of your mouth or nose? #  #  #  YES  #  #  #  Did you need to seek medical attention at a hospital or doctor's office? No When did it last happen? Childhood rxn If all above answers are "NO", may proceed with cephalosporin use.  Pt tolerated 2g  ancef on 02/28/19, no rash, no reaction noted   Ciprofloxacin Hcl     Avoid all fluoroquinolones cause tendon rupture; pt has RA.   Morphine Itching   Sulfamethoxazole Nausea And Vomiting  Goals: Seeks counseling to better understand her complicated relationship with her husband and her enabling behavior. Has symptoms of depression/anxiety as a result. Will utilize FOO exploration and psychodynamic insight oriented therapy to resolve issues. Goal date is 12-23. Patient agrees to have session in person in provider office.  session note Ree Kida): He just had some Mo's surgery and it went well. He states that things are getting back to a level of "calm" or "being okay".  After the last session, things were "awful". He says what he is thinking and then things sometimes get worse. This I why he says he will often not converse, because "whatever I have to say is wrong". I reinforced the need for Ree Kida to be empathetic and validating to [facility] when she is triggered. Told him he needs to be open and available to let Corinn share her despair. Coached on best responses to topics that have been repeatedly brought up.                     Diagnoses:  PTSD  Plan of Care: Outpatient psychotherapy   Garrel Ridgel, PhD 7:45a-8:30ab Time: 45 minutes

## 2022-10-12 ENCOUNTER — Ambulatory Visit (INDEPENDENT_AMBULATORY_CARE_PROVIDER_SITE_OTHER): Payer: Medicare Other | Admitting: Psychology

## 2022-10-12 DIAGNOSIS — F4310 Post-traumatic stress disorder, unspecified: Secondary | ICD-10-CM

## 2022-10-12 NOTE — Progress Notes (Signed)
Guardian/Payee:  N/A    Paperwork requested: No   Reason for Visit /Presenting Problem: low self esteem, marital conflict  Mental Status Exam: Appearance:   Casual     Behavior:  Appropriate  Motor:  Normal  Speech/Language:   Normal Rate  Affect:  Appropriate  Mood:  depressed  Thought process:  normal  Thought content:    WNL  Sensory/Perceptual disturbances:    WNL  Orientation:  oriented to person, place, and time/date  Attention:  Good  Concentration:  Good  Memory:  WNL  Fund of knowledge:   Good  Insight:    Good  Judgment:   Good  Impulse Control:  Good    Reported Symptoms:  anxious,depressed, low self esteem  Risk Assessment: Danger to Self:  No Self-injurious Behavior: No Danger to Others: No Duty to Warn:no Physical Aggression / Violence:No  Access to Firearms a concern: No  Gang Involvement:No  Patient / guardian was educated about steps to take if suicide or homicide risk level increases between visits: n/a While future psychiatric events cannot be accurately predicted, the patient does not currently require acute inpatient psychiatric care and does not currently meet [facility] involuntary commitment criteria.  Substance Abuse History: Current substance abuse: No     Past Psychiatric History:   No previous psychological problems have been observed Outpatient Providers:psychologist History of Psych Hospitalization: No  Psychological Testing:  N/A    Abuse History:  Victim of: No.,  N/A    Report needed: No. Victim of Neglect:No. Perpetrator of emotional and N/A   Witness / Exposure to Domestic Violence: No   Protective Services Involvement: No  Witness to [employer] Violence:  No   Family History:  Family History  Problem Relation Age of Onset   Valvular heart disease Mother    Atrial fibrillation Mother    Hypertension Father    Heart disease  Father    Heart disease Sister    Hypertension Sister    Heart disease Brother    Hypertension Brother    Hypertension Brother     Living situation: the patient lives with their spouse  Sexual Orientation: Straight  Relationship Status: married  Name of spouse / other:[name] If a parent, number of children / ages:40 and 62  Support Systems: N/A  Surveyor, quantity Stress:  No   Income/Employment/Disability: retired  Financial planner: No   Educational History: Education: Risk manager: unknown  Any cultural differences that may affect / interfere with treatment:  not applicable   Recreation/Hobbies: golf, reading,cooking, outdoors  Stressors: Marital or family conflict    Strengths: Friends  Barriers:  Lack of partner Electronics engineer History: Pending legal issue / charges: The patient has no significant history of legal issues. History of legal issue / charges:  N/A  Medical History/Surgical History: not reviewed Past Medical History:  Diagnosis Date   Chest pain    Depression    situational (PMH)   Family history of adverse reaction to anesthesia    Son had " blue extremities and was sluggish waking up after throat surgery ."   Leg pain, right    Osteopenia    Proximal humerus fracture    displaced left  Rheumatoid arthritis (HCC)    UTI (urinary tract infection)    Wears glasses     Past Surgical History:  Procedure Laterality Date   ABDOMINAL HYSTERECTOMY     COLONOSCOPY     FRACTURE SURGERY     KNEE ARTHROSCOPY Right    ORIF HUMERUS FRACTURE Left 02/28/2019   ORIF HUMERUS FRACTURE Left 02/28/2019   Procedure: OPEN REDUCTION INTERNAL FIXATION (ORIF) PROXIMAL HUMERUS FRACTURE;  Surgeon: Francena Hanly, MD;  Location: [facility] OR;  Service: Orthopedics;  Laterality: Left;  to follow 1st case   PARTIAL HYSTERECTOMY     TONSILLECTOMY     WISDOM TOOTH EXTRACTION     WISDOM TOOTH EXTRACTION       Medications: Current Outpatient Medications  Medication Sig Dispense Refill   calcium-vitamin D (OSCAL WITH D) 500-200 MG-UNIT tablet Take 1 tablet by mouth at bedtime.     cetirizine (ZYRTEC) 10 MG tablet Take 10 mg by mouth daily.     cyclobenzaprine (FLEXERIL) 10 MG tablet Take 1 tablet (10 mg total) by mouth 3 (three) times daily as needed for muscle spasms. 30 tablet 1   denosumab (PROLIA) 60 MG/ML SOSY injection Inject 60 mg into the skin every 6 (six) months.     estrogens, conjugated, (PREMARIN) 0.3 MG tablet Take 0.3 mg by mouth daily.      folic acid (FOLVITE) 1 MG tablet Take 3 mg by mouth daily.      magnesium oxide (MAG-OX) 400 MG tablet Take 400 mg by mouth at bedtime.     meloxicam (MOBIC) 15 MG tablet Take 15 mg by mouth daily.     Omega-3 Fatty Acids (FISH OIL) 1000 MG CAPS Take 1,000 mg by mouth daily.     ondansetron (ZOFRAN) 4 MG tablet Take 1 tablet (4 mg total) by mouth every 8 (eight) hours as needed for nausea or vomiting. 10 tablet 0   OVER THE COUNTER MEDICATION Take 500 mg by mouth daily. Black Cumin Seed Oil 500 mg Capsule     oxyCODONE (OXY IR/ROXICODONE) 5 MG immediate release tablet Take 1-2 tablets (5-10 mg total) by mouth every 4 (four) hours as needed for severe pain. 20 tablet 0   traMADol (ULTRAM) 50 MG tablet Take 1 tablet (50 mg total) by mouth every 6 (six) hours as needed for moderate pain. 30 tablet 0   Turmeric 500 MG CAPS Take 500 mg by mouth daily.     No current facility-administered medications for this visit.    Allergies  Allergen Reactions   Penicillin G Rash and Other (See Comments)    Did it involve swelling of the face/tongue/throat, SOB, or low BP? No Did it involve sudden or severe rash/hives, skin peeling, or any reaction on the inside of your mouth or nose? #  #  #  YES  #  #  #  Did you need to seek medical attention at a hospital or doctor's office? No When did it last happen? Childhood rxn If all above answers are "NO", may  proceed with cephalosporin use.  Pt tolerated 2g ancef on 02/28/19, no rash, no reaction noted   Ciprofloxacin Hcl     Avoid all fluoroquinolones cause tendon rupture; pt has RA.   Morphine Itching   Sulfamethoxazole Nausea And Vomiting  Goals: Seeks counseling to better understand her complicated relationship with her husband and her enabling behavior. Has symptoms of depression/anxiety as a result. Will utilize FOO exploration and psychodynamic insight oriented therapy to resolve issues.  Goal date is 12-23. Patient agrees to have session in person in provider office.  session note [place]): States it was a Immunologist for Thanksgiving, but a great deal of fun. She said that she tried to talk with Ree Kida the week before and it did not go well. She expressed herself and he did not respond well. Coached her in talking to Ree Kida and expressing her needs for physical touch. He tells her that he is a good guy but she says "my morality meter tells me differently". She adds "a good guy doesn't do what he did to me and his family". We talked about focusing on the "we" in the relationship. He will always have to deal with her distress and can best deal with it together and get past it. Needs to recognize his role and that the goal is not to let distress last.                         Diagnoses:  PTSD  Plan of Care: Outpatient psychotherapy   Garrel Ridgel, PhD 9:40a-10:30a Time: 50 minutes

## 2022-11-16 ENCOUNTER — Ambulatory Visit: Payer: Medicare Other | Admitting: Psychology

## 2022-12-06 ENCOUNTER — Other Ambulatory Visit: Payer: Self-pay | Admitting: Family Medicine

## 2022-12-06 ENCOUNTER — Ambulatory Visit
Admission: RE | Admit: 2022-12-06 | Discharge: 2022-12-06 | Disposition: A | Discharge status: Home / Self Care | Payer: Medicare Other | Source: Ambulatory Visit | Attending: Family Medicine | Admitting: Family Medicine

## 2022-12-06 DIAGNOSIS — M25511 Pain in right shoulder: Secondary | ICD-10-CM

## 2022-12-07 ENCOUNTER — Ambulatory Visit (INDEPENDENT_AMBULATORY_CARE_PROVIDER_SITE_OTHER): Payer: Medicare Other | Admitting: Psychology

## 2022-12-07 DIAGNOSIS — F4310 Post-traumatic stress disorder, unspecified: Secondary | ICD-10-CM | POA: Diagnosis not present

## 2022-12-07 NOTE — Progress Notes (Signed)
Guardian/Payee:  N/A    Paperwork requested: No   Reason for Visit /Presenting Problem: low self esteem, marital conflict  Mental Status Exam: Appearance:   Casual     Behavior:  Appropriate  Motor:  Normal  Speech/Language:   Normal Rate  Affect:  Appropriate  Mood:  depressed  Thought process:  normal  Thought content:    WNL  Sensory/Perceptual disturbances:    WNL  Orientation:  oriented to person, place, and time/date  Attention:  Good  Concentration:  Good  Memory:  WNL  Fund of knowledge:   Good  Insight:    Good  Judgment:   Good  Impulse Control:  Good    Reported Symptoms:  anxious,depressed, low self esteem  Risk Assessment: Danger to Self:  No Self-injurious Behavior: No Danger to Others: No Duty to Warn:no Physical Aggression / Violence:No  Access to Firearms a concern: No  Gang Involvement:No  Patient / guardian was educated about steps to take if suicide or homicide risk level increases between visits: n/a While future psychiatric events cannot be accurately predicted, the patient does not currently require acute inpatient psychiatric care and does not currently meet Sonya Fowler involuntary commitment criteria.  Substance Abuse History: Current substance abuse: No     Past Psychiatric History:   No previous psychological problems have been observed Outpatient Providers:psychologist History of Psych Hospitalization: No  Psychological Testing:  N/A    Abuse History:  Victim of: No.,  N/A    Report needed: No. Victim of Neglect:No. Perpetrator of emotional and N/A   Witness / Exposure to Domestic Violence: No   Protective Services Involvement: No  Witness to [employer] Violence:  No   Family History:  Family History  Problem Relation Age of Onset   Valvular heart disease Mother    Atrial fibrillation Mother     Hypertension Father    Heart disease Father    Heart disease Sister    Hypertension Sister    Heart disease Brother    Hypertension Brother    Hypertension Brother     Living situation: the patient lives with their spouse  Sexual Orientation: Straight  Relationship Status: married  Name of spouse / other:Sonya Fowler If a parent, number of children / ages:40 and 56  Support Systems: N/A  Museum/gallery curator Stress:  No   Income/Employment/Disability: retired  Armed forces logistics/support/administrative officer: No   Educational History: Education: Scientist, product/process development: unknown  Any cultural differences that may affect / interfere with treatment:  not applicable   Recreation/Hobbies: golf, reading,cooking, outdoors  Stressors: Marital or family conflict    Strengths: Friends  Barriers:  Lack of partner Clinical cytogeneticist History: Pending legal issue / charges: The patient has no significant history of legal issues. History of legal issue / charges:  N/A  Medical History/Surgical History: not reviewed Past Medical History:  Diagnosis Date   Chest pain    Depression    situational (PMH)   Family history of adverse reaction to anesthesia    Son had " blue extremities and was sluggish waking up after throat surgery ."   Leg pain, right  Osteopenia    Proximal humerus fracture    displaced left   Rheumatoid arthritis (HCC)    UTI (urinary tract infection)    Wears glasses     Past Surgical History:  Procedure Laterality Date   ABDOMINAL HYSTERECTOMY     COLONOSCOPY     FRACTURE SURGERY     KNEE ARTHROSCOPY Right    ORIF HUMERUS FRACTURE Left 02/28/2019   ORIF HUMERUS FRACTURE Left 02/28/2019   Procedure: OPEN REDUCTION INTERNAL FIXATION (ORIF) PROXIMAL HUMERUS FRACTURE;  Surgeon: Francena Hanly, MD;  Location: [facility];  Service: Orthopedics;  Laterality: Left;  to follow 1st case   PARTIAL HYSTERECTOMY     TONSILLECTOMY     WISDOM TOOTH EXTRACTION     WISDOM TOOTH  EXTRACTION      Medications: Current Outpatient Medications  Medication Sig Dispense Refill   calcium-vitamin D (OSCAL WITH D) 500-200 MG-UNIT tablet Take 1 tablet by mouth at bedtime.     cetirizine (ZYRTEC) 10 MG tablet Take 10 mg by mouth daily.     cyclobenzaprine (FLEXERIL) 10 MG tablet Take 1 tablet (10 mg total) by mouth 3 (three) times daily as needed for muscle spasms. 30 tablet 1   denosumab (PROLIA) 60 MG/ML SOSY injection Inject 60 mg into the skin every 6 (six) months.     estrogens, conjugated, (PREMARIN) 0.3 MG tablet Take 0.3 mg by mouth daily.      folic acid (FOLVITE) 1 MG tablet Take 3 mg by mouth daily.      magnesium oxide (MAG-OX) 400 MG tablet Take 400 mg by mouth at bedtime.     meloxicam (MOBIC) 15 MG tablet Take 15 mg by mouth daily.     Omega-3 Fatty Acids (FISH OIL) 1000 MG CAPS Take 1,000 mg by mouth daily.     ondansetron (ZOFRAN) 4 MG tablet Take 1 tablet (4 mg total) by mouth every 8 (eight) hours as needed for nausea or vomiting. 10 tablet 0   OVER THE COUNTER MEDICATION Take 500 mg by mouth daily. Black Cumin Seed Oil 500 mg Capsule     oxyCODONE (OXY IR/ROXICODONE) 5 MG immediate release tablet Take 1-2 tablets (5-10 mg total) by mouth every 4 (four) hours as needed for severe pain. 20 tablet 0   traMADol (ULTRAM) 50 MG tablet Take 1 tablet (50 mg total) by mouth every 6 (six) hours as needed for moderate pain. 30 tablet 0   Turmeric 500 MG CAPS Take 500 mg by mouth daily.     No current facility-administered medications for this visit.    Allergies  Allergen Reactions   Penicillin G Rash and Other (See Comments)    Did it involve swelling of the face/tongue/throat, SOB, or low BP? No Did it involve sudden or severe rash/hives, skin peeling, or any reaction on the inside of your mouth or nose? #  #  #  YES  #  #  #  Did you need to seek medical attention at a Fowler or doctor's office? No When did it last happen? Childhood rxn If all above answers  are "NO", may proceed with cephalosporin use.  Pt tolerated 2g ancef on 02/28/19, no rash, no reaction noted   Ciprofloxacin Hcl     Avoid all fluoroquinolones cause tendon rupture; pt has RA.   Morphine Itching   Sulfamethoxazole Nausea And Vomiting  Goals: Seeks counseling to better understand her complicated relationship with her husband and her enabling behavior. Has symptoms of depression/anxiety as  a result. Will utilize Sonya Fowler exploration and psychodynamic insight oriented therapy to resolve issues. Goal date is 12-24. Patient agrees to have session in person in provider office.  session note Sonya Fowler): Sonya Fowler says she and her husband have been doing some traveling. She is having some orthopedic issues as well, which has been frustrating. Says that she and Sonya Fowler are doing a renovation at their [employer] and have been busy preparing. Will be going to Sonya Fowler, Sonya Fowler for a friends retirement party and it may be difficult because it is at the same place where Sonya Fowler had his party. She asked Sonya Fowler to watch a video that is an interview that articulates her feelings. He watched it and his take away was to start asking her "what do you need".  She is longing for touch and she says "there is none". Sonya Fowler is not naturally in touch with his emotions or that of others. Reinforced to her to give Sonya Fowler feedback about behaviors that will help her heal. Told her to have a talk with Sonya Fowler prior to [place] to share with him that there will be triggers and how he can best respond.                              Diagnoses:  PTSD  Plan of Care: Outpatient psychotherapy   Sonya Morel, PhD 9:40a-10:30a Time: 50 minutes

## 2023-01-11 ENCOUNTER — Ambulatory Visit (INDEPENDENT_AMBULATORY_CARE_PROVIDER_SITE_OTHER): Payer: Medicare Other | Admitting: Psychology

## 2023-01-11 DIAGNOSIS — F4310 Post-traumatic stress disorder, unspecified: Secondary | ICD-10-CM

## 2023-01-11 NOTE — Progress Notes (Signed)
Guardian/Payee:  N/A    Paperwork requested: No   Reason for Visit /Presenting Problem: low self esteem, marital conflict  Mental Status Exam: Appearance:   Casual     Behavior:  Appropriate  Motor:  Normal  Speech/Language:   Normal Rate  Affect:  Appropriate  Mood:  depressed  Thought process:  normal  Thought content:    WNL  Sensory/Perceptual disturbances:    WNL  Orientation:  oriented to person, place, and time/date  Attention:  Good  Concentration:  Good  Memory:  WNL  Fund of knowledge:   Good  Insight:    Good  Judgment:   Good  Impulse Control:  Good    Reported Symptoms:  anxious,depressed, low self esteem  Risk Assessment: Danger to Self:  No Self-injurious Behavior: No Danger to Others: No Duty to Warn:no Physical Aggression / Violence:No  Access to Firearms a concern: No  Gang Involvement:No  Patient / guardian was educated about steps to take if suicide or homicide risk level increases between visits: n/a While future psychiatric events cannot be accurately predicted, the patient does not currently require acute inpatient psychiatric care and does not currently meet [facility] involuntary commitment criteria.  Substance Abuse History: Current substance abuse: No     Past Psychiatric History:   No previous psychological problems have been observed Outpatient Providers:psychologist History of Psych Hospitalization: No  Psychological Testing:  N/A    Abuse History:  Victim of: No.,  N/A    Report needed: No. Victim of Neglect:No. Perpetrator of emotional and N/A   Witness / Exposure to Domestic Violence: No   Protective Services Involvement: No  Witness to [employer] Violence:  No   Family History:  Family History  Problem Relation Age of Onset   Valvular heart disease Mother    Atrial  fibrillation Mother    Hypertension Father    Heart disease Father    Heart disease Sister    Hypertension Sister    Heart disease Brother    Hypertension Brother    Hypertension Brother     Living situation: the patient lives with their spouse  Sexual Orientation: Straight  Relationship Status: married  Name of spouse / other:[name] If a parent, number of children / ages:40 and 45  Support Systems: N/A  Museum/gallery curator Stress:  No   Income/Employment/Disability: retired  Armed forces logistics/support/administrative officer: No   Educational History: Education: Scientist, product/process development: unknown  Any cultural differences that may affect / interfere with treatment:  not applicable   Recreation/Hobbies: golf, reading,cooking, outdoors  Stressors: Marital or family conflict    Strengths: Friends  Barriers:  Lack of partner Clinical cytogeneticist History: Pending legal issue / charges: The patient has no significant history of legal issues. History of legal issue / charges:  N/A  Medical History/Surgical History: not reviewed Past Medical History:  Diagnosis Date   Chest pain    Depression    situational (PMH)   Family history of adverse reaction to anesthesia    Son had " blue extremities  and was sluggish waking up after throat surgery ."   Leg pain, right    Osteopenia    Proximal humerus fracture    displaced left   Rheumatoid arthritis ([place])    UTI (urinary tract infection)    Wears glasses     Past Surgical History:  Procedure Laterality Date   ABDOMINAL HYSTERECTOMY     COLONOSCOPY     FRACTURE SURGERY     KNEE ARTHROSCOPY Right    ORIF HUMERUS FRACTURE Left 02/28/2019   ORIF HUMERUS FRACTURE Left 02/28/2019   Procedure: OPEN REDUCTION INTERNAL FIXATION (ORIF) PROXIMAL HUMERUS FRACTURE;  Surgeon: Justice Britain, MD;  Location: [place];  Service: Orthopedics;  Laterality: Left;  114mn to follow 1st case   PARTIAL HYSTERECTOMY     TONSILLECTOMY     WISDOM TOOTH  EXTRACTION     WISDOM TOOTH EXTRACTION      Medications: Current Outpatient Medications  Medication Sig Dispense Refill   calcium-vitamin D (OSCAL WITH D) 500-200 MG-UNIT tablet Take 1 tablet by mouth at bedtime.     cetirizine (ZYRTEC) 10 MG tablet Take 10 mg by mouth daily.     cyclobenzaprine (FLEXERIL) 10 MG tablet Take 1 tablet (10 mg total) by mouth 3 (three) times daily as needed for muscle spasms. 30 tablet 1   denosumab (PROLIA) 60 MG/ML SOSY injection Inject 60 mg into the skin every 6 (six) months.     estrogens, conjugated, (PREMARIN) 0.3 MG tablet Take 0.3 mg by mouth daily.      folic acid (FOLVITE) 1 MG tablet Take 3 mg by mouth daily.      magnesium oxide (MAG-OX) 400 MG tablet Take 400 mg by mouth at bedtime.     meloxicam (MOBIC) 15 MG tablet Take 15 mg by mouth daily.     Omega-3 Fatty Acids (FISH OIL) 1000 MG CAPS Take 1,000 mg by mouth daily.     ondansetron (ZOFRAN) 4 MG tablet Take 1 tablet (4 mg total) by mouth every 8 (eight) hours as needed for nausea or vomiting. 10 tablet 0   OVER THE COUNTER MEDICATION Take 500 mg by mouth daily. Black Cumin Seed Oil 500 mg Capsule     oxyCODONE (OXY IR/ROXICODONE) 5 MG immediate release tablet Take 1-2 tablets (5-10 mg total) by mouth every 4 (four) hours as needed for severe pain. 20 tablet 0   traMADol (ULTRAM) 50 MG tablet Take 1 tablet (50 mg total) by mouth every 6 (six) hours as needed for moderate pain. 30 tablet 0   Turmeric 500 MG CAPS Take 500 mg by mouth daily.     No current facility-administered medications for this visit.    Allergies  Allergen Reactions   Penicillin G Rash and Other (See Comments)    Did it involve swelling of the face/tongue/throat, SOB, or low BP? No Did it involve sudden or severe rash/hives, skin peeling, or any reaction on the inside of your mouth or nose? #  #  #  YES  #  #  #  Did you need to seek medical attention at a hospital or doctor's office? No When did it last happen?  Childhood rxn If all above answers are "NO", may proceed with cephalosporin use.  Pt tolerated 2g ancef on 02/28/19, no rash, no reaction noted   Ciprofloxacin Hcl     Avoid all fluoroquinolones cause tendon rupture; pt has RA.   Morphine Itching   Sulfamethoxazole Nausea And Vomiting  Goals: Seeks counseling to  better understand her complicated relationship with her husband and her enabling behavior. Has symptoms of depression/anxiety as a result. Will utilize [place] exploration and psychodynamic insight oriented therapy to resolve issues. Goal date is 12-24. Patient agrees to have session in person in provider office.  session note Haba and Barnabas Lister): Chryel says she and Barnabas Lister are doing well, but there are still a few "road blocks" in the relationship. The "issue" is his lack of affection. It is a "sin of omission". Often, after dinner, they converse and have some quality time. She wishes he would be more physically expressive of affection. He feels she doesn't want it and says there is no reciprocity. He feels even if he attempts to be physically affectionate, she responds that he is not doing it right. He also feels she is very critical. She feels Barnabas Lister keeps emotions at the surface to avoid his own pain. Says he doesn't understand the depth of her physical and emotional pain. He says "I am not an emotional person". Discussed the challenge to be vulnerable and take emotional risks with each other. She needs to be less judgmental and rejecting when Barnabas Lister is "complying" with her requests for affection (when he hasn't initiated on his own). Barnabas Lister needs to work on recognizing that he should initiate physical expressions of affection both in and out of the bedroom. Both will make efforts to work on this area of their relationship.                              Diagnoses:  PTSD  Plan of Care: Outpatient psychotherapy   Sonya Morel, PhD 8:05a-9:00 aTime: 55 minutes

## 2023-01-25 ENCOUNTER — Ambulatory Visit (INDEPENDENT_AMBULATORY_CARE_PROVIDER_SITE_OTHER): Payer: Medicare Other | Admitting: Psychology

## 2023-01-25 DIAGNOSIS — F4310 Post-traumatic stress disorder, unspecified: Secondary | ICD-10-CM | POA: Diagnosis not present

## 2023-01-25 NOTE — Progress Notes (Signed)
Guardian/Payee:  N/A    Paperwork requested: No   Reason for Visit /Presenting Problem: low self esteem, marital conflict  Mental Status Exam: Appearance:   Casual     Behavior:  Appropriate  Motor:  Normal  Speech/Language:   Normal Rate  Affect:  Appropriate  Mood:  depressed  Thought process:  normal  Thought content:    WNL  Sensory/Perceptual disturbances:    WNL  Orientation:  oriented to person, place, and time/date  Attention:  Good  Concentration:  Good  Memory:  WNL  Fund of knowledge:   Good  Insight:    Good  Judgment:   Good  Impulse Control:  Good    Reported Symptoms:  anxious,depressed, low self esteem  Risk Assessment: Danger to Self:  No Self-injurious Behavior: No Danger to Others: No Duty to Warn:no Physical Aggression / Violence:No  Access to Firearms a concern: No  Gang Involvement:No  Patient / guardian was educated about steps to take if suicide or homicide risk level increases between visits: n/a While future psychiatric events cannot be accurately predicted, the patient does not currently require acute inpatient psychiatric care and does not currently meet [facility] involuntary commitment criteria.  Substance Abuse History: Current substance abuse: No     Past Psychiatric History:   No previous psychological problems have been observed Outpatient Providers:psychologist History of Psych Hospitalization: No  Psychological Testing:  N/A    Abuse History:  Victim of: No.,  N/A    Report needed: No. Victim of Neglect:No. Perpetrator of emotional and N/A   Witness / Exposure to Domestic Violence: No   Protective Services Involvement: No  Witness to [employer] Violence:  No   Family History:  Family History  Problem Relation Age of Onset   Valvular heart  disease Mother    Atrial fibrillation Mother    Hypertension Father    Heart disease Father    Heart disease Sister    Hypertension Sister    Heart disease Brother    Hypertension Brother    Hypertension Brother     Living situation: the patient lives with their spouse  Sexual Orientation: Straight  Relationship Status: married  Name of spouse / other:[name] If a parent, number of children / ages:40 and 44  Support Systems: N/A  Museum/gallery curator Stress:  No   Income/Employment/Disability: retired  Armed forces logistics/support/administrative officer: No   Educational History: Education: Scientist, product/process development: unknown  Any cultural differences that may affect / interfere with treatment:  not applicable   Recreation/Hobbies: golf, reading,cooking, outdoors  Stressors: Marital or family conflict    Strengths: Friends  Barriers:  Lack of partner Clinical cytogeneticist History: Pending legal issue / charges: The patient has no significant history of legal issues. History of legal issue / charges:  N/A  Medical History/Surgical History: not reviewed Past Medical History:  Diagnosis Date   Chest pain    Depression    situational (PMH)  Family history of adverse reaction to anesthesia    Son had " blue extremities and was sluggish waking up after throat surgery ."   Leg pain, right    Osteopenia    Proximal humerus fracture    displaced left   Rheumatoid arthritis ([place])    UTI (urinary tract infection)    Wears glasses     Past Surgical History:  Procedure Laterality Date   ABDOMINAL HYSTERECTOMY     COLONOSCOPY     FRACTURE SURGERY     KNEE ARTHROSCOPY Right    ORIF HUMERUS FRACTURE Left 02/28/2019   ORIF HUMERUS FRACTURE Left 02/28/2019   Procedure: OPEN REDUCTION INTERNAL FIXATION (ORIF) PROXIMAL HUMERUS FRACTURE;  Surgeon: Justice Britain, MD;  Location: [place];  Service: Orthopedics;  Laterality: Left;  138mn to follow 1st case   PARTIAL HYSTERECTOMY      TONSILLECTOMY     WISDOM TOOTH EXTRACTION     WISDOM TOOTH EXTRACTION      Medications: Current Outpatient Medications  Medication Sig Dispense Refill   calcium-vitamin D (OSCAL WITH D) 500-200 MG-UNIT tablet Take 1 tablet by mouth at bedtime.     cetirizine (ZYRTEC) 10 MG tablet Take 10 mg by mouth daily.     cyclobenzaprine (FLEXERIL) 10 MG tablet Take 1 tablet (10 mg total) by mouth 3 (three) times daily as needed for muscle spasms. 30 tablet 1   denosumab (PROLIA) 60 MG/ML SOSY injection Inject 60 mg into the skin every 6 (six) months.     estrogens, conjugated, (PREMARIN) 0.3 MG tablet Take 0.3 mg by mouth daily.      folic acid (FOLVITE) 1 MG tablet Take 3 mg by mouth daily.      magnesium oxide (MAG-OX) 400 MG tablet Take 400 mg by mouth at bedtime.     meloxicam (MOBIC) 15 MG tablet Take 15 mg by mouth daily.     Omega-3 Fatty Acids (FISH OIL) 1000 MG CAPS Take 1,000 mg by mouth daily.     ondansetron (ZOFRAN) 4 MG tablet Take 1 tablet (4 mg total) by mouth every 8 (eight) hours as needed for nausea or vomiting. 10 tablet 0   OVER THE COUNTER MEDICATION Take 500 mg by mouth daily. Black Cumin Seed Oil 500 mg Capsule     oxyCODONE (OXY IR/ROXICODONE) 5 MG immediate release tablet Take 1-2 tablets (5-10 mg total) by mouth every 4 (four) hours as needed for severe pain. 20 tablet 0   traMADol (ULTRAM) 50 MG tablet Take 1 tablet (50 mg total) by mouth every 6 (six) hours as needed for moderate pain. 30 tablet 0   Turmeric 500 MG CAPS Take 500 mg by mouth daily.     No current facility-administered medications for this visit.    Allergies  Allergen Reactions   Penicillin G Rash and Other (See Comments)    Did it involve swelling of the face/tongue/throat, SOB, or low BP? No Did it involve sudden or severe rash/hives, skin peeling, or any reaction on the inside of your mouth or nose? #  #  #  YES  #  #  #  Did you need to seek medical attention at a hospital or doctor's office?  No When did it last happen? Childhood rxn If all above answers are "NO", may proceed with cephalosporin use.  Pt tolerated 2g ancef on 02/28/19, no rash, no reaction noted   Ciprofloxacin Hcl     Avoid all fluoroquinolones cause tendon rupture; pt has RA.  Morphine Itching   Sulfamethoxazole Nausea And Vomiting  Goals: Seeks counseling to better understand her complicated relationship with her husband and her enabling behavior. Has symptoms of depression/anxiety as a result. Will utilize [place] exploration and psychodynamic insight oriented therapy to resolve issues. Goal date is 12-24. Patient agrees to have session in person in provider office.  session note Haynes Dage): Cristine says her arthritis has been difficult. She says that "Barnabas Lister has been very good and attentive. This is how he expresses his affection. Barnabas Lister grew up in a household void of a lot of affect and he too struggles in this arena. She is getting better about asking for affection from Barnabas Lister and being less judgmental. Jomarie says she seeks the affirmation from her husband that she never received from her own father. We talked about jack needing to "raise the bar" and she needs to shift expectations.                                Diagnoses:  PTSD  Plan of Care: Outpatient psychotherapy   Marcelina Morel, PhD 8:05a-9:00 aTime: 55 minutes

## 2023-02-15 ENCOUNTER — Ambulatory Visit (INDEPENDENT_AMBULATORY_CARE_PROVIDER_SITE_OTHER): Payer: Medicare Other | Admitting: Psychology

## 2023-02-15 DIAGNOSIS — F4310 Post-traumatic stress disorder, unspecified: Secondary | ICD-10-CM | POA: Diagnosis not present

## 2023-02-15 NOTE — Progress Notes (Signed)
Guardian/Payee:  N/A    Paperwork requested: No   Reason for Visit /Presenting Problem: low self esteem, marital conflict  Mental Status Exam: Appearance:   Casual     Behavior:  Appropriate  Motor:  Normal  Speech/Language:   Normal Rate  Affect:  Appropriate  Mood:  depressed  Thought process:  normal  Thought content:    WNL  Sensory/Perceptual disturbances:    WNL  Orientation:  oriented to person, place, and time/date  Attention:  Good  Concentration:  Good  Memory:  WNL  Fund of knowledge:   Good  Insight:    Good  Judgment:   Good  Impulse Control:  Good    Reported Symptoms:  anxious,depressed, low self esteem  Risk Assessment: Danger to Self:  No Self-injurious Behavior: No Danger to Others: No Duty to Warn:no Physical Aggression / Violence:No  Access to Firearms a concern: No  Gang Involvement:No  Patient / guardian was educated about steps to take if suicide or homicide risk level increases between visits: n/a While future psychiatric events cannot be accurately predicted, the patient does not currently require acute inpatient psychiatric care and does not currently meet [facility] involuntary commitment criteria.  Substance Abuse History: Current substance abuse: No     Past Psychiatric History:   No previous psychological problems have been observed Outpatient Providers:psychologist History of Psych Hospitalization: No  Psychological Testing:  N/A    Abuse History:  Victim of: No.,  N/A    Report needed: No. Victim of Neglect:No. Perpetrator of emotional and N/A   Witness / Exposure to Domestic Violence: No   Protective Services Involvement: No  Witness to [employer] Violence:  No   Family History:  Family History  Problem Relation  Age of Onset   Valvular heart disease Mother    Atrial fibrillation Mother    Hypertension Father    Heart disease Father    Heart disease Sister    Hypertension Sister    Heart disease Brother    Hypertension Brother    Hypertension Brother     Living situation: the patient lives with their spouse  Sexual Orientation: Straight  Relationship Status: married  Name of spouse / other:[name] If a parent, number of children / ages:40 and 9  Support Systems: N/A  Museum/gallery curator Stress:  No   Income/Employment/Disability: retired  Armed forces logistics/support/administrative officer: No   Educational History: Education: Scientist, product/process development: unknown  Any cultural differences that may affect / interfere with treatment:  not applicable   Recreation/Hobbies: golf, reading,cooking, outdoors  Stressors: Marital or family conflict    Strengths: Friends  Barriers:  Lack of partner Clinical cytogeneticist History: Pending legal issue / charges: The patient has no significant history of legal issues. History of legal issue / charges:  N/A  Medical History/Surgical History: not reviewed Past Medical History:  Diagnosis Date  Chest pain    Depression    situational (PMH)   Family history of adverse reaction to anesthesia    Son had " blue extremities and was sluggish waking up after throat surgery ."   Leg pain, right    Osteopenia    Proximal humerus fracture    displaced left   Rheumatoid arthritis ([place])    UTI (urinary tract infection)    Wears glasses     Past Surgical History:  Procedure Laterality Date   ABDOMINAL HYSTERECTOMY     COLONOSCOPY     FRACTURE SURGERY     KNEE ARTHROSCOPY Right    ORIF HUMERUS FRACTURE Left 02/28/2019   ORIF HUMERUS FRACTURE Left 02/28/2019   Procedure: OPEN REDUCTION INTERNAL FIXATION (ORIF) PROXIMAL HUMERUS FRACTURE;  Surgeon: Justice Britain, MD;  Location: [place];  Service: Orthopedics;  Laterality: Left;  149min to follow 1st case   PARTIAL  HYSTERECTOMY     TONSILLECTOMY     WISDOM TOOTH EXTRACTION     WISDOM TOOTH EXTRACTION      Medications: Current Outpatient Medications  Medication Sig Dispense Refill   calcium-vitamin D (OSCAL WITH D) 500-200 MG-UNIT tablet Take 1 tablet by mouth at bedtime.     cetirizine (ZYRTEC) 10 MG tablet Take 10 mg by mouth daily.     cyclobenzaprine (FLEXERIL) 10 MG tablet Take 1 tablet (10 mg total) by mouth 3 (three) times daily as needed for muscle spasms. 30 tablet 1   denosumab (PROLIA) 60 MG/ML SOSY injection Inject 60 mg into the skin every 6 (six) months.     estrogens, conjugated, (PREMARIN) 0.3 MG tablet Take 0.3 mg by mouth daily.      folic acid (FOLVITE) 1 MG tablet Take 3 mg by mouth daily.      magnesium oxide (MAG-OX) 400 MG tablet Take 400 mg by mouth at bedtime.     meloxicam (MOBIC) 15 MG tablet Take 15 mg by mouth daily.     Omega-3 Fatty Acids (FISH OIL) 1000 MG CAPS Take 1,000 mg by mouth daily.     ondansetron (ZOFRAN) 4 MG tablet Take 1 tablet (4 mg total) by mouth every 8 (eight) hours as needed for nausea or vomiting. 10 tablet 0   OVER THE COUNTER MEDICATION Take 500 mg by mouth daily. Black Cumin Seed Oil 500 mg Capsule     oxyCODONE (OXY IR/ROXICODONE) 5 MG immediate release tablet Take 1-2 tablets (5-10 mg total) by mouth every 4 (four) hours as needed for severe pain. 20 tablet 0   traMADol (ULTRAM) 50 MG tablet Take 1 tablet (50 mg total) by mouth every 6 (six) hours as needed for moderate pain. 30 tablet 0   Turmeric 500 MG CAPS Take 500 mg by mouth daily.     No current facility-administered medications for this visit.    Allergies  Allergen Reactions   Penicillin G Rash and Other (See Comments)    Did it involve swelling of the face/tongue/throat, SOB, or low BP? No Did it involve sudden or severe rash/hives, skin peeling, or any reaction on the inside of your mouth or nose? #  #  #  YES  #  #  #  Did you need to seek medical attention at a hospital or  doctor's office? No When did it last happen? Childhood rxn If all above answers are "NO", may proceed with cephalosporin use.  Pt tolerated 2g ancef on 02/28/19, no rash, no reaction noted   Ciprofloxacin Hcl  Avoid all fluoroquinolones cause tendon rupture; pt has RA.   Morphine Itching   Sulfamethoxazole Nausea And Vomiting  Goals: Seeks counseling to better understand her complicated relationship with her husband and her enabling behavior. Has symptoms of depression/anxiety as a result. Will utilize [place] exploration and psychodynamic insight oriented therapy to resolve issues. Goal date is 12-24. Patient agrees to have session in person in provider office.  session note (couple): Sonya Fowler and Sonya Fowler say that they have been getting along well, especially when busy with other people. She feels that unless busy, there is a "quiet tension". He feels she has an expectation of something more after dinner and he does not. He feels that conversations often drift towards the affair and that is why he avoids. She says it will not always go there but will when frustrated. When she feels he retreats from her she gets frustrated and tends to go down the road of discussing the infidelity. She is wanting to have Sonya Fowler be more responsive to her feelings.                                    Diagnoses:  PTSD  Plan of Care: Outpatient psychotherapy   Marcelina Morel, PhD 8:05a-9:00 aTime: 55 minutes

## 2023-03-01 ENCOUNTER — Ambulatory Visit (INDEPENDENT_AMBULATORY_CARE_PROVIDER_SITE_OTHER): Payer: Medicare Other | Admitting: Psychology

## 2023-03-01 DIAGNOSIS — F4310 Post-traumatic stress disorder, unspecified: Secondary | ICD-10-CM

## 2023-03-01 NOTE — Progress Notes (Signed)
Guardian/Payee:  N/A    Paperwork requested: No   Reason for Visit /Presenting Problem: low self esteem, marital conflict  Mental Status Exam: Appearance:   Casual     Behavior:  Appropriate  Motor:  Normal  Speech/Language:   Normal Rate  Affect:  Appropriate  Mood:  depressed  Thought process:  normal  Thought content:    WNL  Sensory/Perceptual disturbances:    WNL  Orientation:  oriented to person, place, and time/date  Attention:  Good  Concentration:  Good  Memory:  WNL  Fund of knowledge:   Good  Insight:    Good  Judgment:   Good  Impulse Control:  Good    Reported Symptoms:  anxious,depressed, low self esteem  Risk Assessment: Danger to Self:  No Self-injurious Behavior: No Danger to Others: No Duty to Warn:no Physical Aggression / Violence:No  Access to Firearms a concern: No  Gang Involvement:No  Patient / guardian was educated about steps to take if suicide or homicide risk level increases between visits: n/a While future psychiatric events cannot be accurately predicted, the patient does not currently require acute inpatient psychiatric care and does not currently meet [facility] involuntary commitment criteria.  Substance Abuse History: Current substance abuse: No     Past Psychiatric History:   No previous psychological problems have been observed Outpatient Providers:psychologist History of Psych Hospitalization: No  Psychological Testing:  N/A    Abuse History:  Victim of: No.,  N/A    Report needed: No. Victim of Neglect:No. Perpetrator of emotional and N/A   Witness / Exposure to Domestic Violence: No   Protective Services Involvement: No  Witness to [employer] Violence:  No   Family History:   Family History  Problem Relation Age of Onset   Valvular heart disease Mother    Atrial fibrillation Mother    Hypertension Father    Heart disease Father    Heart disease Sister    Hypertension Sister    Heart disease Brother    Hypertension Brother    Hypertension Brother     Living situation: the patient lives with their spouse  Sexual Orientation: Straight  Relationship Status: married  Name of spouse / other:[name] If a parent, number of children / ages:40 and 52  Support Systems: N/A  Surveyor, quantity Stress:  No   Income/Employment/Disability: retired  Financial planner: No   Educational History: Education: Risk manager: unknown  Any cultural differences that may affect / interfere with treatment:  not applicable   Recreation/Hobbies: golf, reading,cooking, outdoors  Stressors: Marital or family conflict    Strengths: Friends  Barriers:  Lack of partner Electronics engineer History: Pending legal issue / charges: The patient has no significant history of legal issues. History of legal issue /  charges:  N/A  Medical History/Surgical History: not reviewed Past Medical History:  Diagnosis Date   Chest pain    Depression    situational (PMH)   Family history of adverse reaction to anesthesia    Son had " blue extremities and was sluggish waking up after throat surgery ."   Leg pain, right    Osteopenia    Proximal humerus fracture    displaced left   Rheumatoid arthritis ([facility])    UTI (urinary tract infection)    Wears glasses     Past Surgical History:  Procedure Laterality Date   ABDOMINAL HYSTERECTOMY     COLONOSCOPY     FRACTURE SURGERY     KNEE ARTHROSCOPY Right    ORIF HUMERUS FRACTURE Left 02/28/2019   ORIF HUMERUS FRACTURE Left 02/28/2019   Procedure: OPEN REDUCTION INTERNAL FIXATION (ORIF) PROXIMAL HUMERUS FRACTURE;  Surgeon: Francena Hanly, MD;  Location: [facility] OR;  Service: Orthopedics;  Laterality: Left;   to follow 1st case   PARTIAL HYSTERECTOMY     TONSILLECTOMY     WISDOM TOOTH EXTRACTION     WISDOM TOOTH EXTRACTION      Medications: Current Outpatient Medications  Medication Sig Dispense Refill   calcium-vitamin D (OSCAL WITH D) 500-200 MG-UNIT tablet Take 1 tablet by mouth at bedtime.     cetirizine (ZYRTEC) 10 MG tablet Take 10 mg by mouth daily.     cyclobenzaprine (FLEXERIL) 10 MG tablet Take 1 tablet (10 mg total) by mouth 3 (three) times daily as needed for muscle spasms. 30 tablet 1   denosumab (PROLIA) 60 MG/ML SOSY injection Inject 60 mg into the skin every 6 (six) months.     estrogens, conjugated, (PREMARIN) 0.3 MG tablet Take 0.3 mg by mouth daily.      folic acid (FOLVITE) 1 MG tablet Take 3 mg by mouth daily.      magnesium oxide (MAG-OX) 400 MG tablet Take 400 mg by mouth at bedtime.     meloxicam (MOBIC) 15 MG tablet Take 15 mg by mouth daily.     Omega-3 Fatty Acids (FISH OIL) 1000 MG CAPS Take 1,000 mg by mouth daily.     ondansetron (ZOFRAN) 4 MG tablet Take 1 tablet (4 mg total) by mouth every 8 (eight) hours as needed for nausea or vomiting. 10 tablet 0   OVER THE COUNTER MEDICATION Take 500 mg by mouth daily. Black Cumin Seed Oil 500 mg Capsule     oxyCODONE (OXY IR/ROXICODONE) 5 MG immediate release tablet Take 1-2 tablets (5-10 mg total) by mouth every 4 (four) hours as needed for severe pain. 20 tablet 0   traMADol (ULTRAM) 50 MG tablet Take 1 tablet (50 mg total) by mouth every 6 (six) hours as needed for moderate pain. 30 tablet 0   Turmeric 500 MG CAPS Take 500 mg by mouth daily.     No current facility-administered medications for this visit.    Allergies  Allergen Reactions   Penicillin G Rash and Other (See Comments)    Did it involve swelling of the face/tongue/throat, SOB, or low BP? No Did it involve sudden or severe rash/hives, skin peeling, or any reaction on the inside of your mouth or nose? #  #  #  YES  #  #  #  Did you need to seek  medical attention at a hospital or doctor's office? No When did it last happen? Childhood rxn If all above answers are "NO", may proceed with cephalosporin  use.  Pt tolerated 2g ancef on 02/28/19, no rash, no reaction noted   Ciprofloxacin Hcl     Avoid all fluoroquinolones cause tendon rupture; pt has RA.   Morphine Itching   Sulfamethoxazole Nausea And Vomiting  Goals: Seeks counseling to better understand her complicated relationship with her husband and her enabling behavior. Has symptoms of depression/anxiety as a result. Will utilize FOO exploration and psychodynamic insight oriented therapy to resolve issues. Goal date is 12-24. Patient agrees to have session in person in provider office.  session note Ree Kida): He stats that things are not going well at home. He and Makaiyah had a blow up the other night.He says that beyond their day to day discussions, they do not agree on a lot of topics (social, political, etc.). He feels that they cannot have "rational" conversations because she thinks that he feels everyone has to agree with him or he shuts them down. He finds they get into irrational discussions and he simply does not know how to respond. He says that she just refers to me as the "privileged white female". After dinner the other night they talked about her upcoming trip with her girlfriends. When he asked her to put the date on her calendar, she went off on him about all of the times he lied to her and did not put things on the calendar. We discussed possible reasons for her response and more appropriate ways for him to react that would likely have a more productive result.                              Diagnoses:  PTSD  Plan of Care: Outpatient psychotherapy    Niel Hummer, PhD 8:05a-9:00 aTime: 55 minutes

## 2023-03-15 ENCOUNTER — Ambulatory Visit (INDEPENDENT_AMBULATORY_CARE_PROVIDER_SITE_OTHER): Payer: Medicare Other | Admitting: Psychology

## 2023-03-15 DIAGNOSIS — F4310 Post-traumatic stress disorder, unspecified: Secondary | ICD-10-CM | POA: Diagnosis not present

## 2023-03-15 NOTE — Progress Notes (Signed)
Guardian/Payee:  N/A    Paperwork requested: No   Reason for Visit /Presenting Problem: low self esteem, marital conflict  Mental Status Exam: Appearance:   Casual     Behavior:  Appropriate  Motor:  Normal  Speech/Language:   Normal Rate  Affect:  Appropriate  Mood:  depressed  Thought process:  normal  Thought content:    WNL  Sensory/Perceptual disturbances:    WNL  Orientation:  oriented to person, place, and time/date  Attention:  Good  Concentration:  Good  Memory:  WNL  Fund of knowledge:   Good  Insight:    Good  Judgment:   Good  Impulse Control:  Good    Reported Symptoms:  anxious,depressed, low self esteem  Risk Assessment: Danger to Self:  No Self-injurious Behavior: No Danger to Others: No Duty to Warn:no Physical Aggression / Violence:No  Access to Firearms a concern: No  Gang Involvement:No  Patient / guardian was educated about steps to take if suicide or homicide risk level increases between visits: n/a While future psychiatric events cannot be accurately predicted, the patient does not currently require acute inpatient psychiatric care and does not currently meet [facility] involuntary commitment criteria.  Substance Abuse History: Current substance abuse: No     Past Psychiatric History:   No previous psychological problems have been observed Outpatient Providers:psychologist History of Psych Hospitalization: No  Psychological Testing:  N/A    Abuse History:  Victim of: No.,  N/A    Report needed: No. Victim of Neglect:No. Perpetrator of emotional and N/A   Witness / Exposure to Domestic Violence: No   Protective Services Involvement: No  Witness to [employer] Violence:   No   Family History:  Family History  Problem Relation Age of Onset   Valvular heart disease Mother    Atrial fibrillation Mother    Hypertension Father    Heart disease Father    Heart disease Sister    Hypertension Sister    Heart disease Brother    Hypertension Brother    Hypertension Brother     Living situation: the patient lives with their spouse  Sexual Orientation: Straight  Relationship Status: married  Name of spouse / other:[name] If a parent, number of children / ages:40 and 30  Support Systems: N/A  Surveyor, quantity Stress:  No   Income/Employment/Disability: retired  Financial planner: No   Educational History: Education: Risk manager: unknown  Any cultural differences that may affect / interfere with treatment:  not applicable   Recreation/Hobbies: golf, reading,cooking, outdoors  Stressors: Marital or family conflict    Strengths: Friends  Barriers:  Lack of partner Electronics engineer History: Pending legal issue /  charges: The patient has no significant history of legal issues. History of legal issue / charges:  N/A  Medical History/Surgical History: not reviewed Past Medical History:  Diagnosis Date   Chest pain    Depression    situational (PMH)   Family history of adverse reaction to anesthesia    Son had " blue extremities and was sluggish waking up after throat surgery ."   Leg pain, right    Osteopenia    Proximal humerus fracture    displaced left   Rheumatoid arthritis ([facility])    UTI (urinary tract infection)    Wears glasses     Past Surgical History:  Procedure Laterality Date   ABDOMINAL HYSTERECTOMY     COLONOSCOPY     FRACTURE SURGERY     KNEE ARTHROSCOPY Right    ORIF HUMERUS FRACTURE Left 02/28/2019   ORIF HUMERUS FRACTURE Left 02/28/2019   Procedure: OPEN REDUCTION INTERNAL FIXATION (ORIF) PROXIMAL HUMERUS FRACTURE;  Surgeon: Francena Hanly, MD;  Location: [facility] OR;  Service:  Orthopedics;  Laterality: Left;  to follow 1st case   PARTIAL HYSTERECTOMY     TONSILLECTOMY     WISDOM TOOTH EXTRACTION     WISDOM TOOTH EXTRACTION      Medications: Current Outpatient Medications  Medication Sig Dispense Refill   calcium-vitamin D (OSCAL WITH D) 500-200 MG-UNIT tablet Take 1 tablet by mouth at bedtime.     cetirizine (ZYRTEC) 10 MG tablet Take 10 mg by mouth daily.     cyclobenzaprine (FLEXERIL) 10 MG tablet Take 1 tablet (10 mg total) by mouth 3 (three) times daily as needed for muscle spasms. 30 tablet 1   denosumab (PROLIA) 60 MG/ML SOSY injection Inject 60 mg into the skin every 6 (six) months.     estrogens, conjugated, (PREMARIN) 0.3 MG tablet Take 0.3 mg by mouth daily.      folic acid (FOLVITE) 1 MG tablet Take 3 mg by mouth daily.      magnesium oxide (MAG-OX) 400 MG tablet Take 400 mg by mouth at bedtime.     meloxicam (MOBIC) 15 MG tablet Take 15 mg by mouth daily.     Omega-3 Fatty Acids (FISH OIL) 1000 MG CAPS Take 1,000 mg by mouth daily.     ondansetron (ZOFRAN) 4 MG tablet Take 1 tablet (4 mg total) by mouth every 8 (eight) hours as needed for nausea or vomiting. 10 tablet 0   OVER THE COUNTER MEDICATION Take 500 mg by mouth daily. Black Cumin Seed Oil 500 mg Capsule     oxyCODONE (OXY IR/ROXICODONE) 5 MG immediate release tablet Take 1-2 tablets (5-10 mg total) by mouth every 4 (four) hours as needed for severe pain. 20 tablet 0   traMADol (ULTRAM) 50 MG tablet Take 1 tablet (50 mg total) by mouth every 6 (six) hours as needed for moderate pain. 30 tablet 0   Turmeric 500 MG CAPS Take 500 mg by mouth daily.     No current facility-administered medications for this visit.    Allergies  Allergen Reactions   Penicillin G Rash and Other (See Comments)    Did it involve swelling of the face/tongue/throat, SOB, or low BP? No Did it involve sudden or severe rash/hives, skin peeling, or any reaction on the inside of your mouth or nose? #  #  #  YES   #  #  #  Did you need to seek medical attention at a hospital or doctor's office? No When did  it last happen? Childhood rxn If all above answers are "NO", may proceed with cephalosporin use.  Pt tolerated 2g ancef on 02/28/19, no rash, no reaction noted   Ciprofloxacin Hcl     Avoid all fluoroquinolones cause tendon rupture; pt has RA.   Morphine Itching   Sulfamethoxazole Nausea And Vomiting  Goals: Seeks counseling to better understand her complicated relationship with her husband and her enabling behavior. Has symptoms of depression/anxiety as a result. Will utilize FOO exploration and psychodynamic insight oriented therapy to resolve issues. Goal date is 12-24. Patient agrees to have session in person in provider office.  session note Ree Kida): He says that he is here alone because Kida feels that he is doing better and is hoping that he will continue to benefit. He shared an incident where she got ngry at him when, in fact, he was trying to help her out. I advised him to let her share her upset  and then revisit it later on, when he can explaining his intentions. We talked about his need to consistently validate her. He says when she brings up her feelings, he gets "kind of angry" because he feels she needs to move on. He says "she does not let things go". He also feels frustrated because he cannot express his anger at her.    What was missing in marriage before the affair? He says "if anything, it was support". He says he went through tough times at work and she would always challenge him on what he said... in a confrontive way. He felt a divide between them got bigger when he moved away for other job. We talked about the fact that they see the world through different lenses.  Will talk more about tolerating her                             Diagnoses:  PTSD  Plan of Care: Outpatient psychotherapy    Niel Hummer, PhD 1:05p-2:00 p Time: 55 minutes

## 2023-03-29 ENCOUNTER — Ambulatory Visit (INDEPENDENT_AMBULATORY_CARE_PROVIDER_SITE_OTHER): Payer: Medicare Other | Admitting: Psychology

## 2023-03-29 DIAGNOSIS — F4310 Post-traumatic stress disorder, unspecified: Secondary | ICD-10-CM | POA: Diagnosis not present

## 2023-03-29 NOTE — Progress Notes (Signed)
Guardian/Payee:  N/A    Paperwork requested: No   Reason for Visit /Presenting Problem: low self esteem, marital conflict  Mental Status Exam: Appearance:   Casual     Behavior:  Appropriate  Motor:  Normal  Speech/Language:   Normal Rate  Affect:  Appropriate  Mood:  depressed  Thought process:  normal  Thought content:    WNL  Sensory/Perceptual disturbances:    WNL  Orientation:  oriented to person, place, and time/date  Attention:  Good  Concentration:  Good  Memory:  WNL  Fund of knowledge:   Good  Insight:    Good  Judgment:   Good  Impulse Control:  Good    Reported Symptoms:  anxious,depressed, low self esteem  Risk Assessment: Danger to Self:  No Self-injurious Behavior: No Danger to Others: No Duty to Warn:no Physical Aggression / Violence:No  Access to Firearms a concern: No  Gang Involvement:No  Patient / guardian was educated about steps to take if suicide or homicide risk level increases between visits: n/a While future psychiatric events cannot be accurately predicted, the patient does not currently require acute inpatient psychiatric care and does not currently meet [facility] involuntary commitment criteria.  Substance Abuse History: Current substance abuse: No     Past Psychiatric History:   No previous psychological problems have been observed Outpatient Providers:psychologist History of Psych Hospitalization: No  Psychological Testing:  N/A    Abuse History:  Victim of: No.,  N/A    Report needed: No. Victim of Neglect:No. Perpetrator of emotional and N/A   Witness / Exposure to Domestic Violence: No   Protective Services Involvement: No   Witness to [employer] Violence:  No   Family History:  Family History  Problem Relation Age of Onset   Valvular heart disease Mother    Atrial fibrillation Mother    Hypertension Father    Heart disease Father    Heart disease Sister    Hypertension Sister    Heart disease Brother    Hypertension Brother    Hypertension Brother     Living situation: the patient lives with their spouse  Sexual Orientation: Straight  Relationship Status: married  Name of spouse / other:[name] If a parent, number of children / ages:40 and 50  Support Systems: N/A  Surveyor, quantity Stress:  No   Income/Employment/Disability: retired  Financial planner: No   Educational History: Education: Risk manager: unknown  Any cultural differences that may affect / interfere with treatment:  not applicable   Recreation/Hobbies: golf, reading,cooking, outdoors  Stressors: Marital or family conflict    Strengths: Friends  Barriers:  Lack of partner cooperation   Legal History: Pending legal issue / charges: The patient has no significant history of legal issues. History of legal issue / charges:  N/A  Medical History/Surgical History: not reviewed Past Medical History:  Diagnosis Date   Chest pain    Depression    situational (PMH)   Family history of adverse reaction to anesthesia    Son had " blue extremities and was sluggish waking up after throat surgery ."   Leg pain, right    Osteopenia    Proximal humerus fracture    displaced left   Rheumatoid arthritis (HCC)    UTI (urinary tract infection)    Wears glasses     Past Surgical History:  Procedure Laterality Date   ABDOMINAL HYSTERECTOMY     COLONOSCOPY     FRACTURE SURGERY     KNEE ARTHROSCOPY Right    ORIF HUMERUS FRACTURE Left 02/28/2019   ORIF HUMERUS FRACTURE Left 02/28/2019   Procedure: OPEN REDUCTION INTERNAL FIXATION (ORIF) PROXIMAL HUMERUS FRACTURE;  Surgeon: Francena Hanly, MD;   Location: [facility] OR;  Service: Orthopedics;  Laterality: Left;  to follow 1st case   PARTIAL HYSTERECTOMY     TONSILLECTOMY     WISDOM TOOTH EXTRACTION     WISDOM TOOTH EXTRACTION      Medications: Current Outpatient Medications  Medication Sig Dispense Refill   calcium-vitamin D (OSCAL WITH D) 500-200 MG-UNIT tablet Take 1 tablet by mouth at bedtime.     cetirizine (ZYRTEC) 10 MG tablet Take 10 mg by mouth daily.     cyclobenzaprine (FLEXERIL) 10 MG tablet Take 1 tablet (10 mg total) by mouth 3 (three) times daily as needed for muscle spasms. 30 tablet 1   denosumab (PROLIA) 60 MG/ML SOSY injection Inject 60 mg into the skin every 6 (six) months.     estrogens, conjugated, (PREMARIN) 0.3 MG tablet Take 0.3 mg by mouth daily.      folic acid (FOLVITE) 1 MG tablet Take 3 mg by mouth daily.      magnesium oxide (MAG-OX) 400 MG tablet Take 400 mg by mouth at bedtime.     meloxicam (MOBIC) 15 MG tablet Take 15 mg by mouth daily.     Omega-3 Fatty Acids (FISH OIL) 1000 MG CAPS Take 1,000 mg by mouth daily.     ondansetron (ZOFRAN) 4 MG tablet Take 1 tablet (4 mg total) by mouth every 8 (eight) hours as needed for nausea or vomiting. 10 tablet 0   OVER THE COUNTER MEDICATION Take 500 mg by mouth daily. Black Cumin Seed Oil 500 mg Capsule     oxyCODONE (OXY IR/ROXICODONE) 5 MG immediate release tablet Take 1-2 tablets (5-10 mg total) by mouth every 4 (four) hours as needed for severe pain. 20 tablet 0   traMADol (ULTRAM) 50 MG tablet Take 1 tablet (50 mg total) by mouth every 6 (six) hours as needed for moderate pain. 30 tablet 0   Turmeric 500 MG CAPS Take 500 mg by mouth daily.     No current facility-administered medications for this visit.    Allergies  Allergen Reactions   Penicillin G Rash and Other (See Comments)    Did it involve swelling of the face/tongue/throat, SOB, or low BP? No Did it involve sudden or severe rash/hives, skin peeling, or any reaction on the inside of your  mouth or nose? #  #  #  YES  #  #  #  Did you  need to seek medical attention at a hospital or doctor's office? No When did it last happen? Childhood rxn If all above answers are "NO", may proceed with cephalosporin use.  Pt tolerated 2g ancef on 02/28/19, no rash, no reaction noted   Ciprofloxacin Hcl     Avoid all fluoroquinolones cause tendon rupture; pt has RA.   Morphine Itching   Sulfamethoxazole Nausea And Vomiting  Goals: Seeks counseling to better understand her complicated relationship with her husband and her enabling behavior. Has symptoms of depression/anxiety as a result. Will utilize FOO exploration and psychodynamic insight oriented therapy to resolve issues. Goal date is 12-24. Patient agrees to have session in person in provider office.  session note Clerance Lav): She reports that things are better at home and that Ree Kida is doing better than before. She feels that he is more responsive to when she is triggered. She talked about her anniversary, which is a difficult day because of how he has acted on that date in the past. She said that he has been much more understanding of her reactions. She says that she is feeling gratitude and recognizes that they are both not perfect. She feels that Ree Kida has lost interest in intimacy with her. She says that he can't make it through a movie without sleeping and thinks that the alcohol intake is a factor. She fears his lack of libido is a reflection of her and their relationship. She says he will have 2 vodka cocktails before dinner and then split a bottle of wine. We talked about the ways that the alcohol consumption may be a factor in libido and performance.                                Diagnoses:  PTSD  Plan of Care: Outpatient psychotherapy   Garrel Ridgel, PhD 1:05p-2:00 p Time: 55 minutes

## 2023-04-12 ENCOUNTER — Ambulatory Visit: Payer: Medicare Other | Admitting: Psychology

## 2023-04-26 ENCOUNTER — Ambulatory Visit (INDEPENDENT_AMBULATORY_CARE_PROVIDER_SITE_OTHER): Payer: Medicare Other | Admitting: Psychology

## 2023-04-26 DIAGNOSIS — F4310 Post-traumatic stress disorder, unspecified: Secondary | ICD-10-CM
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
## 2023-05-10 ENCOUNTER — Ambulatory Visit: Payer: Medicare Other | Admitting: Psychology

## 2023-05-24 ENCOUNTER — Ambulatory Visit: Payer: Medicare Other | Admitting: Psychology

## 2023-06-07 ENCOUNTER — Ambulatory Visit: Payer: Medicare Other | Admitting: Psychology

## 2023-06-07 DIAGNOSIS — F4310 Post-traumatic stress disorder, unspecified: Secondary | ICD-10-CM
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
## 2023-06-21 ENCOUNTER — Ambulatory Visit: Payer: Medicare Other | Admitting: Psychology

## 2023-07-05 ENCOUNTER — Ambulatory Visit: Payer: Medicare Other | Admitting: Psychology

## 2023-07-05 DIAGNOSIS — F4310 Post-traumatic stress disorder, unspecified: Secondary | ICD-10-CM | POA: Diagnosis not present

## 2023-07-05 NOTE — Progress Notes (Signed)
Guardian/Payee:  N/A    Paperwork requested: No   Reason for Visit /Presenting Problem: low self esteem, marital conflict  Mental Status Exam: Appearance:   Casual     Behavior:  Appropriate  Motor:  Normal  Speech/Language:   Normal Rate  Affect:  Appropriate  Mood:  depressed  Thought process:  normal  Thought content:    WNL  Sensory/Perceptual disturbances:    WNL  Orientation:  oriented to person, place, and time/date  Attention:  Good  Concentration:  Good  Memory:  WNL  Fund of knowledge:   Good  Insight:    Good  Judgment:   Good  Impulse Control:  Good    Reported Symptoms:  anxious,depressed, low self esteem  Risk Assessment: Danger to Self:  No Self-injurious Behavior: No Danger to Others: No Duty to Warn:no Physical Aggression / Violence:No  Access to Firearms a concern: No  Gang Involvement:No  Patient / guardian was educated about steps to take if suicide or homicide risk level increases between visits: n/a While future psychiatric events cannot be accurately predicted, the patient does not currently require acute inpatient psychiatric care and does not currently meet [facility] involuntary commitment criteria.  Substance Abuse History: Current substance abuse: No     Past Psychiatric History:   No previous psychological problems have been observed Outpatient Providers:psychologist History of Psych Hospitalization: No  Psychological Testing:  N/A    Abuse History:  Victim of: No.,  N/A    Report needed: No. Victim of Neglect:No. Perpetrator of emotional and N/A    Witness / Exposure to Domestic Violence: No   Protective Services Involvement: No  Witness to [employer] Violence:  No   Family History:  Family History  Problem Relation Age of Onset   Valvular heart disease Mother    Atrial fibrillation Mother    Hypertension Father    Heart disease Father    Heart disease Sister    Hypertension Sister    Heart disease Brother    Hypertension Brother    Hypertension Brother     Living situation: the patient lives with their spouse  Sexual Orientation: Straight  Relationship Status: married  Name of spouse / other:Sonya Fowler If a parent, number of children / ages:40 and 12  Support Systems: N/A  Surveyor, quantity Stress:  No   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 History of legal issue / charges:  N/A  Medical History/Surgical History: not reviewed Past Medical History:  Diagnosis Date   Chest pain    Depression    situational (PMH)   Family history of adverse reaction to anesthesia    Son had " blue extremities and was sluggish waking up after throat surgery ."   Leg pain, right    Osteopenia    Proximal humerus fracture    displaced left   Rheumatoid arthritis (HCC)    UTI (urinary tract infection)    Wears glasses     Past Surgical History:  Procedure Laterality Date   ABDOMINAL HYSTERECTOMY     COLONOSCOPY     FRACTURE SURGERY     KNEE ARTHROSCOPY Right    ORIF HUMERUS FRACTURE Left 02/28/2019   ORIF HUMERUS FRACTURE Left 02/28/2019   Procedure: OPEN REDUCTION  INTERNAL FIXATION (ORIF) PROXIMAL HUMERUS FRACTURE;  Surgeon: Francena Hanly, MD;  Location: [facility] OR;  Service: Orthopedics;  Laterality: Left;  to follow 1st case   PARTIAL HYSTERECTOMY     TONSILLECTOMY     WISDOM TOOTH EXTRACTION     WISDOM TOOTH EXTRACTION      Medications: Current Outpatient Medications  Medication Sig Dispense Refill   calcium-vitamin D (OSCAL WITH D) 500-200 MG-UNIT tablet Take 1 tablet by mouth at bedtime.     cetirizine (ZYRTEC) 10 MG tablet Take 10 mg by mouth daily.     cyclobenzaprine (FLEXERIL) 10 MG tablet Take 1 tablet (10 mg total) by mouth 3 (three) times daily as needed for muscle spasms. 30 tablet 1   denosumab (PROLIA) 60 MG/ML SOSY injection Inject 60 mg into the skin every 6 (six) months.     estrogens, conjugated, (PREMARIN) 0.3 MG tablet Take 0.3 mg by mouth daily.      folic acid (FOLVITE) 1 MG tablet Take 3 mg by mouth daily.      magnesium oxide (MAG-OX) 400 MG tablet Take 400 mg by mouth at bedtime.     meloxicam (MOBIC) 15 MG tablet Take 15 mg by mouth daily.     Omega-3 Fatty Acids (FISH OIL) 1000 MG CAPS Take 1,000 mg by mouth daily.     ondansetron (ZOFRAN) 4 MG tablet Take 1 tablet (4 mg total) by mouth every 8 (eight) hours as needed for nausea or vomiting. 10 tablet 0   OVER THE COUNTER MEDICATION Take 500 mg by mouth daily. Black Cumin Seed Oil 500 mg Capsule     oxyCODONE (OXY IR/ROXICODONE) 5 MG immediate release tablet Take 1-2 tablets (5-10 mg total) by mouth every 4 (four) hours as needed for severe pain. 20 tablet 0   traMADol (ULTRAM) 50 MG tablet Take 1 tablet (50 mg total) by mouth every 6 (six) hours as needed for moderate pain. 30 tablet 0   Turmeric 500 MG CAPS Take 500 mg by mouth daily.     No current facility-administered medications for this visit.    Allergies  Allergen Reactions   Penicillin G Rash and Other (See Comments)    Did it involve swelling  of the face/tongue/throat, SOB, or low BP? No Did it involve  sudden or severe rash/hives, skin peeling, or any reaction on the inside of your mouth or nose? #  #  #  YES  #  #  #  Did you need to seek medical attention at a hospital or doctor's office? No When did it last happen? Childhood rxn If all above answers are "NO", may proceed with cephalosporin use.  Pt tolerated 2g ancef on 02/28/19, no rash, no reaction noted   Ciprofloxacin Hcl     Avoid all fluoroquinolones cause tendon rupture; pt has RA.   Morphine Itching   Sulfamethoxazole Nausea And Vomiting  Goals: Seeks counseling to better understand her complicated relationship with her husband and her enabling behavior. Has symptoms of depression/anxiety as a result. Will utilize FOO exploration and psychodynamic insight oriented therapy to resolve issues. Goal date is 12-24. Patient agrees to have session in person in provider office.  session note Clerance Lav): Sonya Fowler-knows that "intimacy" is the difficulty for Sonya Fowler at this time. He feels that when he responds to her "it is never the right way". She will tell him "it is not genuine". He says he is consciously trying to improve. She tells him "it is like your heart is not in it". Talked to him about his "desire" for her rather than just complying with her request. He says he isn't sure if his hugs are any different than they were in the past. He says he fatigues of the negative feedback from her. He feels that way about some of their sexual interactions. He feels that she criticizes most all of his actions, related to relationship or not. We need to discuss this issue, as it impacts his feelings about hugs and intimacy. He says that Sonya Fowler has "always been particular about things". He does say there has been improvement. He feels the intimacy breaks down in that "whatever I do, it is not quite right". In general, he feels that she doesn't compliment often. She did however, with the construction at the [place]. He says that Sonya Fowler gets very  "combative" if he gives her feedback in that she turns it back on him. He says that Sonya Fowler told him she needs to feel more wanted and desired, and that he isn't doing a good job at it. She tells him that she doesn't feel attractive. He says that the attractiveness has more to do with her demeanor and less about physical attributes. He says that the other frustrating issue for him is that if he raises the issue, she responds with "I am the way I am because of what you did". Sonya Fowler states that he now just apologizes and doesn't respond with anger. He sees her as much lighter and joyful when they are with other people.                                 Diagnoses:  PTSD  Plan of Care: Outpatient psychotherapy   Garrel Ridgel, PhD 11:40a-12:30 p Time: 50 minutes

## 2023-07-19 ENCOUNTER — Ambulatory Visit: Payer: Medicare Other | Admitting: Psychology

## 2023-08-02 ENCOUNTER — Ambulatory Visit: Payer: Medicare Other | Admitting: Psychology

## 2023-08-16 ENCOUNTER — Ambulatory Visit: Payer: Medicare Other | Admitting: Psychology

## 2023-08-16 DIAGNOSIS — F4310 Post-traumatic stress disorder, unspecified: Secondary | ICD-10-CM
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
## 2023-08-30 ENCOUNTER — Ambulatory Visit: Payer: Medicare Other | Admitting: Psychology

## 2023-08-30 DIAGNOSIS — F4310 Post-traumatic stress disorder, unspecified: Secondary | ICD-10-CM | POA: Diagnosis not present
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
## 2023-09-13 ENCOUNTER — Ambulatory Visit: Payer: Medicare Other | Admitting: Psychology

## 2023-09-27 ENCOUNTER — Ambulatory Visit: Payer: Medicare Other | Admitting: Psychology

## 2023-10-11 ENCOUNTER — Ambulatory Visit: Payer: Medicare Other | Admitting: Psychology

## 2023-10-25 ENCOUNTER — Ambulatory Visit: Payer: Medicare Other | Admitting: Psychology

## 2023-10-25 DIAGNOSIS — F4310 Post-traumatic stress disorder, unspecified: Secondary | ICD-10-CM | POA: Diagnosis not present

## 2023-10-25 NOTE — Progress Notes (Signed)
Guardian/Payee:  N/A    Paperwork requested: No   Reason for Visit /Presenting Problem: low self esteem, marital conflict  Mental Status Exam: Appearance:   Casual     Behavior:  Appropriate  Motor:  Normal  Speech/Language:   Normal Rate  Affect:  Appropriate  Mood:  depressed  Thought process:  normal  Thought content:    WNL  Sensory/Perceptual disturbances:    WNL  Orientation:  oriented to person, place, and time/date  Attention:  Good  Concentration:  Good  Memory:  WNL  Fund of knowledge:   Good  Insight:    Good  Judgment:   Good  Impulse Control:  Good    Reported Symptoms:  anxious,depressed, low self esteem  Risk Assessment: Danger to Self:  No Self-injurious Behavior: No Danger to Others: No Duty to Warn:no Physical Aggression / Violence:No  Access to Firearms a concern: No  Gang Involvement:No  Patient / guardian was educated about steps to take if suicide or homicide risk level increases between visits: n/a While future psychiatric events cannot be accurately predicted, the patient does not currently require acute inpatient psychiatric care and does not currently meet [facility] involuntary commitment criteria.  Substance Abuse History: Current substance abuse: No     Past Psychiatric History:   No previous psychological problems have been observed Outpatient Providers:psychologist History of Psych Hospitalization: No  Psychological Testing:  N/A    Abuse History:  Victim of: No.,  N/A    Report needed: No. Victim of Neglect:No. Perpetrator of emotional and N/A   Witness / Exposure to Domestic Violence: No   Protective Services Involvement: No   Witness to [employer] Violence:  No   Family History:  Family History  Problem Relation Age of Onset   Valvular heart disease Mother    Atrial fibrillation Mother    Hypertension Father    Heart disease Father    Heart disease Sister    Hypertension Sister    Heart disease Brother    Hypertension Brother    Hypertension Brother     Living situation: the patient lives with their spouse  Sexual Orientation: Straight  Relationship Status: married  Name of spouse / other:Sonya Fowler If a parent, number of children / ages:40 and 34  Support Systems: N/A  Surveyor, quantity Stress:  No   Income/Employment/Disability: retired  Financial planner: No   Educational History: Education: Risk manager: unknown  Any cultural differences that may affect / interfere with treatment:  not applicable   Recreation/Hobbies: golf, reading,cooking, outdoors  Stressors: Marital or family conflict    Strengths: Friends  Barriers:  Lack of partner cooperation   Legal History: Pending legal issue / charges: The patient has no significant history of legal issues. History of legal issue / charges:  N/A  Medical History/Surgical History: not reviewed Past Medical History:  Diagnosis Date   Chest pain    Depression    situational (PMH)   Family history of adverse reaction to anesthesia    Son had " blue extremities and was sluggish waking up after throat surgery ."   Leg pain, right    Osteopenia    Proximal humerus fracture    displaced left   Rheumatoid arthritis (HCC)    UTI (urinary tract infection)    Wears glasses     Past Surgical History:  Procedure Laterality Date   ABDOMINAL HYSTERECTOMY     COLONOSCOPY     FRACTURE SURGERY     KNEE ARTHROSCOPY Right    ORIF HUMERUS FRACTURE Left 02/28/2019   ORIF HUMERUS FRACTURE Left 02/28/2019   Procedure: OPEN REDUCTION INTERNAL FIXATION (ORIF) PROXIMAL HUMERUS FRACTURE;  Surgeon: Francena Hanly, MD;   Location: [facility] OR;  Service: Orthopedics;  Laterality: Left;  to follow 1st case   PARTIAL HYSTERECTOMY     TONSILLECTOMY     WISDOM TOOTH EXTRACTION     WISDOM TOOTH EXTRACTION      Medications: Current Outpatient Medications  Medication Sig Dispense Refill   calcium-vitamin D (OSCAL WITH D) 500-200 MG-UNIT tablet Take 1 tablet by mouth at bedtime.     cetirizine (ZYRTEC) 10 MG tablet Take 10 mg by mouth daily.     cyclobenzaprine (FLEXERIL) 10 MG tablet Take 1 tablet (10 mg total) by mouth 3 (three) times daily as needed for muscle spasms. 30 tablet 1   denosumab (PROLIA) 60 MG/ML SOSY injection Inject 60 mg into the skin every 6 (six) months.     estrogens, conjugated, (PREMARIN) 0.3 MG tablet Take 0.3 mg by mouth daily.      folic acid (FOLVITE) 1 MG tablet Take 3 mg by mouth daily.      magnesium oxide (MAG-OX) 400 MG tablet Take 400 mg by mouth at bedtime.     meloxicam (MOBIC) 15 MG tablet Take 15 mg by mouth daily.     Omega-3 Fatty Acids (FISH OIL) 1000 MG CAPS Take 1,000 mg by mouth daily.     ondansetron (ZOFRAN) 4 MG tablet Take 1 tablet (4 mg total) by mouth every 8 (eight) hours as needed for nausea or vomiting. 10 tablet 0   OVER THE COUNTER MEDICATION Take 500 mg by mouth daily. Black Cumin Seed Oil 500 mg Capsule     oxyCODONE (OXY IR/ROXICODONE) 5 MG immediate release tablet Take 1-2 tablets (5-10 mg total) by mouth every 4 (four) hours as needed for severe pain. 20 tablet 0   traMADol (ULTRAM) 50 MG tablet Take 1 tablet (50 mg total) by mouth every 6 (six) hours as needed for moderate pain. 30 tablet 0   Turmeric 500 MG CAPS Take 500 mg by mouth daily.     No current facility-administered medications for this visit.    Allergies  Allergen Reactions   Penicillin G Rash and Other (See Comments)    Did it involve swelling of the face/tongue/throat, SOB, or low BP? No Did it involve sudden or severe rash/hives, skin peeling, or any reaction on the inside of your  mouth or nose? #  #  #  YES  #  #  #  Did you  need to seek medical attention at a hospital or doctor's office? No When did it last happen? Childhood rxn If all above answers are "NO", may proceed with cephalosporin use.  Pt tolerated 2g ancef on 02/28/19, no rash, no reaction noted   Ciprofloxacin Hcl     Avoid all fluoroquinolones cause tendon rupture; pt has RA.   Morphine Itching   Sulfamethoxazole Nausea And Vomiting  Goals: Seeks counseling to better understand her complicated relationship with her husband and her enabling behavior. Has symptoms of depression/anxiety as a result. Will utilize FOO exploration and psychodynamic insight oriented therapy to resolve issues. Goal date is 12-25 Patient agrees to have session in person in provider office.  session note: Sonya Fowler had all the family in for Thanksgiving. She said that one of the reasons she didn't walk away from the family is that the extended family is very important to her. Going to [place] week before Christmas, then come home and fly out to [place] on Christmas day. Sonya Fowler says that she and Sonya Fowler are doing better and she feels he would say "excellent". She is still missing the affection, day to day, from Sonya Fowler. She says he is "kind and pulls his weight". He is better about responding to her, but NEVER initiating.  Sonya Fowler says she has also been stressed that her younger brother had by-pass surgery just before Thanksgiving. She feels she is more realistic now than ever before about her marriage and what Sonya Fowler is capable (and not capable) of. Her son Sonya Fowler ("the high achiever") talked with her during the holiday. He acknowledged that he is aware of some of what she experienced and it made her sad. She feels he is hurting and she wants to connect and support him. We talked about how to make the effort t get together and she will approach                                      Diagnoses:  PTSD  Plan of Care: Outpatient  psychotherapy    Sonya Ridgel, Sonya Fowler 11:40a-112:30 p Time: 50 minutes                              Sonya Ridgel, Sonya Fowler

## 2023-11-09 ENCOUNTER — Ambulatory Visit: Payer: Medicare Other | Admitting: Psychology

## 2024-09-12 ENCOUNTER — Other Ambulatory Visit: Payer: Self-pay | Admitting: Orthopedic Surgery

## 2024-09-12 DIAGNOSIS — M259 Joint disorder, unspecified: Secondary | ICD-10-CM

## 2024-09-27 ENCOUNTER — Ambulatory Visit
Admission: RE | Admit: 2024-09-27 | Discharge: 2024-09-27 | Disposition: A | Discharge status: Home / Self Care | Source: Ambulatory Visit | Attending: Orthopedic Surgery | Admitting: Orthopedic Surgery

## 2024-09-27 DIAGNOSIS — M259 Joint disorder, unspecified: Secondary | ICD-10-CM

## 2024-09-27 MED ORDER — IOPAMIDOL (ISOVUE-M 200) INJECTION 41%
1.0000 mL | Freq: Once | INTRAMUSCULAR | Status: AC | PRN
  Administered 2024-09-27: 1 mL via INTRATHECAL
